# Patient Record
Sex: Female | Born: 2000 | Race: Black or African American | Hispanic: No | Marital: Single | Smoking: Never smoker
Health system: Southern US, Community
[De-identification: ages and names within clinical notes are randomized; demographics above are authoritative.]

## PROBLEM LIST (undated history)

## (undated) ENCOUNTER — Inpatient Hospital Stay (HOSPITAL_COMMUNITY): Payer: Self-pay

## (undated) DIAGNOSIS — Z3490 Encounter for supervision of normal pregnancy, unspecified, unspecified trimester: Secondary | ICD-10-CM

## (undated) DIAGNOSIS — Z789 Other specified health status: Secondary | ICD-10-CM

## (undated) HISTORY — DX: Other specified health status: Z78.9

## (undated) HISTORY — PX: NO PAST SURGERIES: SHX2092

---
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
## 2018-04-09 NOTE — L&D Delivery Note (Signed)
I entered the the room due deep variables lasting 1 minute at most with return to baseline. There was progressively slower return but Armie was pushing well with fetal progress. She was consented for a vacuum if FHR did not improve.  She was able to push for two more contractions after consent and delivered the fetal head easily. Notable shoulder/shawl cord that was delivered through and reduced on the maternal perineum.   Delivery Note At 3:03 PM a viable female was delivered via Vaginal, Spontaneous (Presentation: OA).  APGAR: 8,9; weight-pending  .   Placenta status:Spontaneous ,intact .  Cord: 3VC  with the following complications: shawl/nuchal cord .  Cord pH: not collected  Anesthesia:  Epidural Episiotomy: None Lacerations: Deep peri clitoral and 2nd degree perineal- repaired in typical fashion.  Suture Repair: 3.0 vicryl Est. Blood Loss (mL):    Mom to postpartum.  Baby to Couplet care / Skin to Skin.  Juanita Craver [facility] 12/24/2018, 3:27 PM

## 2018-04-28 ENCOUNTER — Ambulatory Visit (HOSPITAL_COMMUNITY)
Admission: EM | Admit: 2018-04-28 | Discharge: 2018-04-28 | Disposition: A | Discharge status: Home / Self Care | Payer: Medicaid Other | Attending: Family Medicine | Admitting: Family Medicine

## 2018-04-28 ENCOUNTER — Encounter (HOSPITAL_COMMUNITY): Payer: Self-pay | Admitting: Emergency Medicine

## 2018-04-28 DIAGNOSIS — Z3201 Encounter for pregnancy test, result positive: Secondary | ICD-10-CM | POA: Insufficient documentation

## 2018-04-28 LAB — POCT PREGNANCY, URINE: Preg Test, Ur: POSITIVE — AB

## 2018-04-28 MED ORDER — PRENATAL COMPLETE 14-0.4 MG PO TABS: 1.0000 | ORAL_TABLET | Freq: Every day | ORAL | 1 refills | Status: DC

## 2018-04-28 NOTE — Discharge Instructions (Signed)
Your pregnancy test was positive We will have you start a prenatal vitamin You need to follow-up with your OB/GYN for further management of this pregnancy I am giving you a list of medications that are safe during pregnancy

## 2018-04-28 NOTE — ED Triage Notes (Signed)
Pt here requesting pregnancy test; pt sts her period is late
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
## 2018-05-04 ENCOUNTER — Emergency Department (HOSPITAL_COMMUNITY): Payer: Medicaid Other

## 2018-05-04 ENCOUNTER — Other Ambulatory Visit: Payer: Self-pay

## 2018-05-04 ENCOUNTER — Encounter (HOSPITAL_COMMUNITY): Payer: Self-pay | Admitting: Emergency Medicine

## 2018-05-04 ENCOUNTER — Emergency Department (HOSPITAL_COMMUNITY)
Admission: EM | Admit: 2018-05-04 | Discharge: 2018-05-04 | Disposition: A | Discharge status: Home / Self Care | Payer: Medicaid Other | Attending: Emergency Medicine | Admitting: Emergency Medicine

## 2018-05-04 DIAGNOSIS — R102 Pelvic and perineal pain: Secondary | ICD-10-CM | POA: Diagnosis present

## 2018-05-04 DIAGNOSIS — N760 Acute vaginitis: Secondary | ICD-10-CM

## 2018-05-04 DIAGNOSIS — O26891 Other specified pregnancy related conditions, first trimester: Secondary | ICD-10-CM | POA: Insufficient documentation

## 2018-05-04 DIAGNOSIS — O23591 Infection of other part of genital tract in pregnancy, first trimester: Secondary | ICD-10-CM | POA: Diagnosis not present

## 2018-05-04 DIAGNOSIS — B9689 Other specified bacterial agents as the cause of diseases classified elsewhere: Secondary | ICD-10-CM

## 2018-05-04 DIAGNOSIS — Z3A01 Less than 8 weeks gestation of pregnancy: Secondary | ICD-10-CM | POA: Insufficient documentation

## 2018-05-04 LAB — WET PREP, GENITAL
Sperm: NONE SEEN
Trich, Wet Prep: NONE SEEN
WBC, Wet Prep HPF POC: NONE SEEN
YEAST WET PREP: NONE SEEN

## 2018-05-04 LAB — URINALYSIS, ROUTINE W REFLEX MICROSCOPIC
BILIRUBIN URINE: NEGATIVE
Glucose, UA: NEGATIVE mg/dL
HGB URINE DIPSTICK: NEGATIVE
Ketones, ur: NEGATIVE mg/dL
Leukocytes, UA: NEGATIVE
Nitrite: NEGATIVE
Protein, ur: NEGATIVE mg/dL
Specific Gravity, Urine: 1.017 (ref 1.005–1.030)
pH: 8 (ref 5.0–8.0)

## 2018-05-04 LAB — ABO/RH: ABO/RH(D): O POS

## 2018-05-04 LAB — HCG, QUANTITATIVE, PREGNANCY: hCG, Beta Chain, Quant, S: 75988 m[IU]/mL — ABNORMAL HIGH (ref <5)

## 2018-05-04 MED ORDER — METRONIDAZOLE 500 MG PO TABS: 500.0000 mg | ORAL_TABLET | Freq: Two times a day (BID) | ORAL | 0 refills | Status: DC

## 2018-05-04 NOTE — Discharge Instructions (Addendum)
Please take medication as prescribed for bacterial vaginosis Follow-up with OB as scheduled Return if pain is worse or other worsening symptoms such as fever, vomiting

## 2018-05-04 NOTE — ED Triage Notes (Signed)
Pt. Stated, I HAVE LITTLE PAINS OFF AND ON WHICH I dont think I should. No other symptoms just little sharpe pains off and on.
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
## 2018-05-04 NOTE — ED Notes (Signed)
Patient transported to Ultrasound 

## 2018-05-05 LAB — GC/CHLAMYDIA PROBE AMP (~~LOC~~) NOT AT ARMC
Chlamydia: NEGATIVE
Neisseria Gonorrhea: NEGATIVE

## 2018-05-21 ENCOUNTER — Ambulatory Visit (INDEPENDENT_AMBULATORY_CARE_PROVIDER_SITE_OTHER): Payer: Medicaid Other | Admitting: Obstetrics & Gynecology

## 2018-05-21 ENCOUNTER — Encounter: Payer: Self-pay | Admitting: Obstetrics & Gynecology

## 2018-05-21 ENCOUNTER — Other Ambulatory Visit (HOSPITAL_COMMUNITY)
Admission: RE | Admit: 2018-05-21 | Discharge: 2018-05-21 | Disposition: A | Discharge status: Home / Self Care | Payer: Medicaid Other | Source: Ambulatory Visit | Attending: Obstetrics & Gynecology | Admitting: Obstetrics & Gynecology

## 2018-05-21 DIAGNOSIS — Z3401 Encounter for supervision of normal first pregnancy, first trimester: Secondary | ICD-10-CM

## 2018-05-21 DIAGNOSIS — Z3A09 9 weeks gestation of pregnancy: Secondary | ICD-10-CM

## 2018-05-21 DIAGNOSIS — Z3400 Encounter for supervision of normal first pregnancy, unspecified trimester: Secondary | ICD-10-CM | POA: Diagnosis present

## 2018-05-21 LAB — POCT URINALYSIS DIPSTICK OB
Glucose, UA: NEGATIVE
Leukocytes, UA: NEGATIVE
Nitrite, UA: NEGATIVE
POC,PROTEIN,UA: NEGATIVE
RBC UA: NEGATIVE
Spec Grav, UA: 1.015 (ref 1.010–1.025)
pH, UA: 6 (ref 5.0–8.0)

## 2018-05-21 NOTE — Progress Notes (Signed)
DATING AND VIABILITY SONOGRAM   Bianca Webb is a 18 y.o. year old G1P0000 with LMP Patient's last menstrual period was 03/17/2018 (exact date). which would correlate to  [redacted]w[redacted]d weeks gestation.  She has regular menstrual cycles.   She is here today for a confirmatory initial sonogram.    GESTATION: SINGLETON   FETAL ACTIVITY:          Heart rate         175 bpm          The fetus is active.  ADNEXA: The ovaries are normal.   GESTATIONAL AGE AND  BIOMETRICS:  Gestational criteria: Estimated Date of Delivery: 12/22/18 by LMP now at [redacted]w[redacted]d  Previous Scans:0      CROWN RUMP LENGTH          2.46 cm        9-1weeks                                                                               AVERAGE EGA(BY THIS SCAN): 9-1 weeks  WORKING EDD( LMP ):  12-22-2018     TECHNICIAN COMMENTS:     Patient informed that the ultrasound is considered a limited obstetric ultrasound and is not intended to be a complete ultrasound exam. Patient also informed that the ultrasound is not being completed with the intent of assessing for fetal or placental anomalies or any pelvic abnormalities. Explained that the purpose of today's ultrasound is to assess for fetal heart rate. Patient acknowledges the purpose of the exam and the limitations of the study.       Armandina Stammer 05/21/2018 3:40 PM

## 2018-05-21 NOTE — Patient Instructions (Signed)
First Trimester of Pregnancy  The first trimester of pregnancy is from week 1 until the end of week 13 (months 1 through 3). A week after a sperm fertilizes an egg, the egg will implant on the wall of the uterus. This embryo will begin to develop into a baby. Genes from you and your partner will form the baby. The female genes will determine whether the baby will be a boy or a girl. At 6-8 weeks, the eyes and face will be formed, and the heartbeat can be seen on ultrasound. At the end of 12 weeks, all the baby's organs will be formed.  Now that you are pregnant, you will want to do everything you can to have a healthy baby. Two of the most important things are to get good prenatal care and to follow your health care provider's instructions. Prenatal care is all the medical care you receive before the baby's birth. This care will help prevent, find, and treat any problems during the pregnancy and childbirth.  Body changes during your first trimester  Your body goes through many changes during pregnancy. The changes vary from woman to woman.   You may gain or lose a couple of pounds at first.   You may feel sick to your stomach (nauseous) and you may throw up (vomit). If the vomiting is uncontrollable, call your health care provider.   You may tire easily.   You may develop headaches that can be relieved by medicines. All medicines should be approved by your health care provider.   You may urinate more often. Painful urination may mean you have a bladder infection.   You may develop heartburn as a result of your pregnancy.   You may develop constipation because certain hormones are causing the muscles that push stool through your intestines to slow down.   You may develop hemorrhoids or swollen veins (varicose veins).   Your breasts may begin to grow larger and become tender. Your nipples may stick out more, and the tissue that surrounds them (areola) may become darker.   Your gums may bleed and may be  sensitive to brushing and flossing.   Dark spots or blotches (chloasma, mask of pregnancy) may develop on your face. This will likely fade after the baby is born.   Your menstrual periods will stop.   You may have a loss of appetite.   You may develop cravings for certain kinds of food.   You may have changes in your emotions from day to day, such as being excited to be pregnant or being concerned that something may go wrong with the pregnancy and baby.   You may have more vivid and strange dreams.   You may have changes in your hair. These can include thickening of your hair, rapid growth, and changes in texture. Some women also have hair loss during or after pregnancy, or hair that feels dry or thin. Your hair will most likely return to normal after your baby is born.  What to expect at prenatal visits  During a routine prenatal visit:   You will be weighed to make sure you and the baby are growing normally.   Your blood pressure will be taken.   Your abdomen will be measured to track your baby's growth.   The fetal heartbeat will be listened to between weeks 10 and 14 of your pregnancy.   Test results from any previous visits will be discussed.  Your health care provider may ask you:     How you are feeling.   If you are feeling the baby move.   If you have had any abnormal symptoms, such as leaking fluid, bleeding, severe headaches, or abdominal cramping.   If you are using any tobacco products, including cigarettes, chewing tobacco, and electronic cigarettes.   If you have any questions.  Other tests that may be performed during your first trimester include:   Blood tests to find your blood type and to check for the presence of any previous infections. The tests will also be used to check for low iron levels (anemia) and protein on red blood cells (Rh antibodies). Depending on your risk factors, or if you previously had diabetes during pregnancy, you may have tests to check for high blood sugar  that affects pregnant women (gestational diabetes).   Urine tests to check for infections, diabetes, or protein in the urine.   An ultrasound to confirm the proper growth and development of the baby.   Fetal screens for spinal cord problems (spina bifida) and Down syndrome.   HIV (human immunodeficiency virus) testing. Routine prenatal testing includes screening for HIV, unless you choose not to have this test.   You may need other tests to make sure you and the baby are doing well.  Follow these instructions at home:  Medicines   Follow your health care provider's instructions regarding medicine use. Specific medicines may be either safe or unsafe to take during pregnancy.   Take a prenatal vitamin that contains at least 600 micrograms (mcg) of folic acid.   If you develop constipation, try taking a stool softener if your health care provider approves.  Eating and drinking     Eat a balanced diet that includes fresh fruits and vegetables, whole grains, good sources of protein such as meat, eggs, or tofu, and low-fat dairy. Your health care provider will help you determine the amount of weight gain that is right for you.   Avoid raw meat and uncooked cheese. These carry germs that can cause birth defects in the baby.   Eating four or five small meals rather than three large meals a day may help relieve nausea and vomiting. If you start to feel nauseous, eating a few soda crackers can be helpful. Drinking liquids between meals, instead of during meals, also seems to help ease nausea and vomiting.   Limit foods that are high in fat and processed sugars, such as fried and sweet foods.   To prevent constipation:  ? Eat foods that are high in fiber, such as fresh fruits and vegetables, whole grains, and beans.  ? Drink enough fluid to keep your urine clear or pale yellow.  Activity   Exercise only as directed by your health care provider. Most women can continue their usual exercise routine during  pregnancy. Try to exercise for 30 minutes at least 5 days a week. Exercising will help you:  ? Control your weight.  ? Stay in shape.  ? Be prepared for labor and delivery.   Experiencing pain or cramping in the lower abdomen or lower back is a good sign that you should stop exercising. Check with your health care provider before continuing with normal exercises.   Try to avoid standing for long periods of time. Move your legs often if you must stand in one place for a long time.   Avoid heavy lifting.   Wear low-heeled shoes and practice good posture.   You may continue to have sex unless your health care   provider tells you not to.  Relieving pain and discomfort   Wear a good support bra to relieve breast tenderness.   Take warm sitz baths to soothe any pain or discomfort caused by hemorrhoids. Use hemorrhoid cream if your health care provider approves.   Rest with your legs elevated if you have leg cramps or low back pain.   If you develop varicose veins in your legs, wear support hose. Elevate your feet for 15 minutes, 3-4 times a day. Limit salt in your diet.  Prenatal care   Schedule your prenatal visits by the twelfth week of pregnancy. They are usually scheduled monthly at first, then more often in the last 2 months before delivery.   Write down your questions. Take them to your prenatal visits.   Keep all your prenatal visits as told by your health care provider. This is important.  Safety   Wear your seat belt at all times when driving.   Make a list of emergency phone numbers, including numbers for family, friends, the hospital, and police and fire departments.  General instructions   Ask your health care provider for a referral to a local prenatal education class. Begin classes no later than the beginning of month 6 of your pregnancy.   Ask for help if you have counseling or nutritional needs during pregnancy. Your health care provider can offer advice or refer you to specialists for help  with various needs.   Do not use hot tubs, steam rooms, or saunas.   Do not douche or use tampons or scented sanitary pads.   Do not cross your legs for long periods of time.   Avoid cat litter boxes and soil used by cats. These carry germs that can cause birth defects in the baby and possibly loss of the fetus by miscarriage or stillbirth.   Avoid all smoking, herbs, alcohol, and medicines not prescribed by your health care provider. Chemicals in these products affect the formation and growth of the baby.   Do not use any products that contain nicotine or tobacco, such as cigarettes and e-cigarettes. If you need help quitting, ask your health care provider. You may receive counseling support and other resources to help you quit.   Schedule a dentist appointment. At home, brush your teeth with a soft toothbrush and be gentle when you floss.  Contact a health care provider if:   You have dizziness.   You have mild pelvic cramps, pelvic pressure, or nagging pain in the abdominal area.   You have persistent nausea, vomiting, or diarrhea.   You have a bad smelling vaginal discharge.   You have pain when you urinate.   You notice increased swelling in your face, hands, legs, or ankles.   You are exposed to fifth disease or chickenpox.   You are exposed to German measles (rubella) and have never had it.  Get help right away if:   You have a fever.   You are leaking fluid from your vagina.   You have spotting or bleeding from your vagina.   You have severe abdominal cramping or pain.   You have rapid weight gain or loss.   You vomit blood or material that looks like coffee grounds.   You develop a severe headache.   You have shortness of breath.   You have any kind of trauma, such as from a fall or a car accident.  Summary   The first trimester of pregnancy is from week 1 until   the end of week 13 (months 1 through 3).   Your body goes through many changes during pregnancy. The changes vary from  woman to woman.   You will have routine prenatal visits. During those visits, your health care provider will examine you, discuss any test results you may have, and talk with you about how you are feeling.  This information is not intended to replace advice given to you by your health care provider. Make sure you discuss any questions you have with your health care provider.  Document Released: 03/20/2001 Document Revised: 03/07/2016 Document Reviewed: 03/07/2016  [employer]vier Interactive Patient Education  2019 [employer]
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
## 2018-05-23 LAB — GC/CHLAMYDIA PROBE AMP (~~LOC~~) NOT AT ARMC
CHLAMYDIA, DNA PROBE: NEGATIVE
Neisseria Gonorrhea: NEGATIVE

## 2018-05-23 LAB — CULTURE, OB URINE

## 2018-05-23 LAB — URINE CULTURE, OB REFLEX

## 2018-05-30 LAB — OBSTETRIC PANEL, INCLUDING HIV
ANTIBODY SCREEN: NEGATIVE
Basophils Absolute: 0 10*3/uL (ref 0.0–0.2)
Basos: 0 %
EOS (ABSOLUTE): 0.1 10*3/uL (ref 0.0–0.4)
Eos: 1 %
HIV Screen 4th Generation wRfx: NONREACTIVE
Hematocrit: 39.4 % (ref 34.0–46.6)
Hemoglobin: 13.2 g/dL (ref 11.1–15.9)
Hepatitis B Surface Ag: NEGATIVE
Immature Grans (Abs): 0 10*3/uL (ref 0.0–0.1)
Immature Granulocytes: 0 %
Lymphocytes Absolute: 2.9 10*3/uL (ref 0.7–3.1)
Lymphs: 29 %
MCH: 30.9 pg (ref 26.6–33.0)
MCHC: 33.5 g/dL (ref 31.5–35.7)
MCV: 92 fL (ref 79–97)
Monocytes Absolute: 0.6 10*3/uL (ref 0.1–0.9)
Monocytes: 6 %
NEUTROS PCT: 64 %
Neutrophils Absolute: 6.4 10*3/uL (ref 1.4–7.0)
Platelets: 226 10*3/uL (ref 150–450)
RBC: 4.27 x10E6/uL (ref 3.77–5.28)
RDW: 13.3 % (ref 11.7–15.4)
RPR Ser Ql: NONREACTIVE
Rh Factor: POSITIVE
Rubella Antibodies, IGG: 8.4 index (ref >0.99)
WBC: 10 10*3/uL (ref 3.4–10.8)

## 2018-05-30 LAB — SMN1 COPY NUMBER ANALYSIS (SMA CARRIER SCREENING)

## 2018-05-30 LAB — HEMOGLOBINOPATHY EVALUATION
Ferritin: 59 ng/mL (ref 15–77)
Hgb A2 Quant: 2.8 % (ref 1.8–3.2)
Hgb A: 97.2 % (ref 96.4–98.8)
Hgb C: 0 %
Hgb F Quant: 0 % (ref 0.0–2.0)
Hgb S: 0 %
Hgb Solubility: NEGATIVE
Hgb Variant: 0 %

## 2018-06-18 ENCOUNTER — Other Ambulatory Visit: Payer: Self-pay

## 2018-06-18 ENCOUNTER — Ambulatory Visit (INDEPENDENT_AMBULATORY_CARE_PROVIDER_SITE_OTHER): Payer: Medicaid Other | Admitting: Certified Nurse Midwife

## 2018-06-18 DIAGNOSIS — Z3400 Encounter for supervision of normal first pregnancy, unspecified trimester: Secondary | ICD-10-CM

## 2018-06-18 DIAGNOSIS — Z3401 Encounter for supervision of normal first pregnancy, first trimester: Secondary | ICD-10-CM

## 2018-06-18 DIAGNOSIS — Z3A13 13 weeks gestation of pregnancy: Secondary | ICD-10-CM
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
## 2018-06-19 ENCOUNTER — Encounter: Payer: Medicaid Other | Admitting: Family Medicine

## 2018-06-19 NOTE — Progress Notes (Signed)
Subjective: Bianca Webb is a G1P0000 at [redacted]w[redacted]d who presents to the [facility] today for ob visit.  She does not have a history of any mental health concerns. She is not currently sexually active. She is previously used pills for birth control.  Patient states family  as her support system.   BP 100/67   Pulse 88   Wt 132 lb (59.9 kg)   LMP 03/17/2018 (Exact Date)   BMI 25.78 kg/m   Birth Control History:  pills  MDM Patient counseled on all options for birth control today including LARC. Patient desires nexplanon or depo  initiated for birth control.   Assessment:  18 y.o. female considering additional family planning  for birth control  Plan: additional family planning counseling   Bianca Webb 06/19/2018 4:16 PM

## 2018-06-24 ENCOUNTER — Encounter: Payer: Self-pay | Admitting: Certified Nurse Midwife

## 2018-07-01 ENCOUNTER — Telehealth: Payer: Self-pay | Admitting: Obstetrics

## 2018-07-01 ENCOUNTER — Encounter: Payer: Self-pay | Admitting: Certified Nurse Midwife

## 2018-07-01 NOTE — Telephone Encounter (Signed)
Called patient and discussed abnormal test for SMA.  All questions answered to her satisfaction.  MFM referral to be made.

## 2018-07-01 NOTE — Telephone Encounter (Signed)
Called patient to discuss abnormal SMA test.  No answer.  Left message to call office. 

## 2018-07-14 ENCOUNTER — Ambulatory Visit (HOSPITAL_COMMUNITY): Payer: Medicaid Other

## 2018-07-17 ENCOUNTER — Ambulatory Visit (INDEPENDENT_AMBULATORY_CARE_PROVIDER_SITE_OTHER): Payer: Medicaid Other | Admitting: Certified Nurse Midwife

## 2018-07-17 ENCOUNTER — Other Ambulatory Visit: Payer: Self-pay

## 2018-07-17 VITALS — BP 103/71 | HR 97 | Wt 143.0 lb

## 2018-07-17 DIAGNOSIS — Z3400 Encounter for supervision of normal first pregnancy, unspecified trimester: Secondary | ICD-10-CM

## 2018-07-17 DIAGNOSIS — Z3A17 17 weeks gestation of pregnancy: Secondary | ICD-10-CM

## 2018-07-17 DIAGNOSIS — O26899 Other specified pregnancy related conditions, unspecified trimester: Secondary | ICD-10-CM

## 2018-07-17 DIAGNOSIS — R102 Pelvic and perineal pain: Secondary | ICD-10-CM

## 2018-07-17 DIAGNOSIS — O26892 Other specified pregnancy related conditions, second trimester: Secondary | ICD-10-CM

## 2018-07-17 MED ORDER — COMFORT FIT MATERNITY SUPP MED MISC: 1.0000 | Freq: Every day | 0 refills | Status: DC
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
## 2018-07-17 NOTE — Patient Instructions (Signed)
° ° °Coronavirus (COVID-19) and Pregnancy:  Frequently Asked Questions ° ° °How might coronavirus affect my pregnancy? °The data for COVID-19 is limited, but we know that women with other coronavirus infections (such as SARS-CoV) did NOT have miscarriage or stillbirth at higher rates than the general population.  On the other hand, we know that having other respiratory viral infections during pregnancy, such as flu, has been associated with problems like low birth weight and preterm birth. Also, having a high fever early in pregnancy may increase the risk of certain birth defects. ° °Could I transmit coronavirus to my baby during pregnancy or delivery? °Among the few case studies of infants born to mothers with COVID-19 published in peer-reviewed literature, none of the infants tested positive for the virus. And there have been no reports of mother-to-baby transmission for other coronaviruses (MERS-CoV and SARS-CoV). Also, there was no virus detected in samples of amniotic fluid or breast milk. °But there have been a few reports of newborns as young as a few days old with infection, suggesting that a mother can transmit the infection to her infant through close contact after delivery. ° °Is it safe for me to deliver at a hospital where there have been COVID-19 cases? °It should be. We know that COVID-19 is a very scary virus. The good news is that hospitals are taking great precautions to keep patients and healthcare providers safe.  According to the CDC guidelines, when a patient is even suspected to have COVID-19, they should be placed in a negative pressure room. (Think of these rooms as vacuums that suck and filter the air so it's safe for the other people in the hospital.) If there are no rooms available, these patients should be asked to wait at home until they can be accomodated safely. This should make it possible for you to deliver at the hospital without putting you or your baby at risk.  °Hospitals are  also implementing stricter visiting policies to keep patients safe. It's worth calling your hospital to check if there are any new regulations to be aware of. ° °What plans should I make now in case the hospital system is overwhelmed when it's time for me to deliver? °Every hospital is making different plans for dealing with this scenario. Talk with your doctor or midwife once you're at least [redacted] weeks pregnant. °I work in healthcare.  ° °I work in healthcare. Should I ask my doctor to excuse me from work until the baby is born? Should I ask my doctor to excuse me from work until the baby is born? What if I work in a school, the travel industry, or some other high-risk setting? °Healthcare facilities should take care to limit the exposure of pregnant employees to patients with confirmed or suspected COVID-19, just as they would with other infectious cases. If you continue working, be sure to follow the CDC's risk assessment and infection control guidelines. ° °If you work in a school, travel industry, or other high-risk setting, talk with your employer about what it's doing to protect employees and minimize infection risks. Wash your hands often. ° ° °What if my OB gets COVID-19? °If your doctor or midwife tests positive for COVID-19, they will need to be quarantined until they recover and are no longer at risk of transmitting the virus. In this case, you'll be assigned to another OB in your doctor's practice (or you may choose another practitioner yourself). °Ask your new OB or your doctor's office if you should   self-quarantine or be tested for the virus. (It will depend on when you last saw your provider and when that person tested positive.) ° °Should we hold off on trying to conceive because of COVID-19? °At this time, there's no reason to hold off on trying to get pregnant, but the data we have is really limited. For example, we don't think the virus causes birth defects or increases your risk of miscarriage.  But we don't know for sure whether you could transmit COVID-19 to your baby before or during delivery. °We also don't know if the virus lives in semen or can be sexually transmitted. ° °We have a babymoon scheduled in the next few months - should we cancel? °Yes. At this time, the virus has reached more than 140 countries, and there are travel bans to [place], most of [place], and [place]. Places where large numbers of people gather are at highest risk, especially airports and cruise ships.  If you were planning travel in the [place] note that any travel setting increases your risk of exposure, and there are already many places where everyone is being asked to stay home. To see how the virus is spreading, check The [place] Times map based on CDC data.  °For the most current advice to help you avoid exposure, check the CDC's COVID-19 travel page. ° °Will the hospital separate me from my newborn and keep the baby in quarantine? °If you don't have COVID-19 and have not been exposed to the virus, the hospital will not separate you from your newborn. °If you do test positive for COVID-19 or have been exposed but have no symptoms, the CDC, American College of Obstetricians and Gynecologists, and the Society for Maternal-Fetal Medicine all recommend that you be separated from your baby to decrease the risk of transmission to the baby. This would last until you are no longer at risk of transmitting the virus.  °This scenario would, of course, be beyond heartbreaking. Talk to the hospital, your baby's pediatrician, and your family about how to plan for care of your baby in the event that you have to be separated after delivery. And try to make sure you have the emotional support you would need to endure the sadness and stress of having to potentially wait weeks to meet your newborn. ° ° °My hospital is restricting visitors and only allowing one support person. If my support person leaves after the delivery, will they be allowed to  come back? °Every hospital has different policies. Contact your hospital or labor and delivery unit a week or so before delivery to get the most up-to-date restrictions. °In general, if your support person needs to leave, they would be allowed back unless they knew they were exposed to COVID-19 after leaving your company. ° °My mom was planning to fly here to help me care for my new baby after delivery. Should I tell her not to come? °Yes. If your mom is over 60 or has any serious chronic medical conditions (such as heart disease, lung disease, or diabetes), she is at higher risk of serious illness from COVID-19 and should avoid air travel. °And remember that any travel setting increases a person's risk of exposure. So, it may be risky to have her around the baby after she has been traveling. °For the most current advice on traveling, check the CDC's COVID-19 travel page. °

## 2018-07-19 LAB — AFP, SERUM, OPEN SPINA BIFIDA
AFP MoM: 1.3
AFP Value: 57.9 ng/mL
Gest. Age on Collection Date: 17.3 weeks
Maternal Age At EDD: 20 yr
OSBR Risk 1 IN: 9606
Test Results:: NEGATIVE
Weight: 143 [lb_av]

## 2018-07-28 ENCOUNTER — Ambulatory Visit (HOSPITAL_COMMUNITY): Payer: Medicaid Other

## 2018-07-28 ENCOUNTER — Other Ambulatory Visit (HOSPITAL_COMMUNITY): Payer: Medicaid Other

## 2018-08-01 ENCOUNTER — Ambulatory Visit (HOSPITAL_COMMUNITY)
Admission: RE | Admit: 2018-08-01 | Discharge: 2018-08-01 | Disposition: A | Discharge status: Home / Self Care | Payer: Medicaid Other | Source: Ambulatory Visit | Attending: Obstetrics and Gynecology | Admitting: Obstetrics and Gynecology

## 2018-08-01 ENCOUNTER — Other Ambulatory Visit: Payer: Self-pay

## 2018-08-01 DIAGNOSIS — Z3A19 19 weeks gestation of pregnancy: Secondary | ICD-10-CM | POA: Diagnosis not present

## 2018-08-01 DIAGNOSIS — Z363 Encounter for antenatal screening for malformations: Secondary | ICD-10-CM

## 2018-08-01 DIAGNOSIS — Z3400 Encounter for supervision of normal first pregnancy, unspecified trimester: Secondary | ICD-10-CM | POA: Insufficient documentation

## 2018-08-06 ENCOUNTER — Inpatient Hospital Stay (HOSPITAL_COMMUNITY)
Admission: AD | Admit: 2018-08-06 | Discharge: 2018-08-06 | Disposition: A | Discharge status: Home / Self Care | Payer: Medicaid Other | Attending: Family Medicine | Admitting: Family Medicine

## 2018-08-06 ENCOUNTER — Other Ambulatory Visit: Payer: Self-pay

## 2018-08-06 ENCOUNTER — Encounter (HOSPITAL_COMMUNITY): Payer: Self-pay

## 2018-08-06 DIAGNOSIS — O9989 Other specified diseases and conditions complicating pregnancy, childbirth and the puerperium: Secondary | ICD-10-CM | POA: Diagnosis present

## 2018-08-06 DIAGNOSIS — K5901 Slow transit constipation: Secondary | ICD-10-CM

## 2018-08-06 DIAGNOSIS — Z79899 Other long term (current) drug therapy: Secondary | ICD-10-CM | POA: Diagnosis not present

## 2018-08-06 DIAGNOSIS — O26892 Other specified pregnancy related conditions, second trimester: Secondary | ICD-10-CM | POA: Diagnosis not present

## 2018-08-06 DIAGNOSIS — O26899 Other specified pregnancy related conditions, unspecified trimester: Secondary | ICD-10-CM

## 2018-08-06 DIAGNOSIS — Z3A20 20 weeks gestation of pregnancy: Secondary | ICD-10-CM | POA: Diagnosis not present

## 2018-08-06 DIAGNOSIS — O99612 Diseases of the digestive system complicating pregnancy, second trimester: Secondary | ICD-10-CM | POA: Insufficient documentation

## 2018-08-06 DIAGNOSIS — R102 Pelvic and perineal pain unspecified side: Secondary | ICD-10-CM

## 2018-08-06 LAB — URINALYSIS, ROUTINE W REFLEX MICROSCOPIC
Bilirubin Urine: NEGATIVE
Glucose, UA: NEGATIVE mg/dL
Hgb urine dipstick: NEGATIVE
Ketones, ur: NEGATIVE mg/dL
Leukocytes,Ua: NEGATIVE
Nitrite: NEGATIVE
Protein, ur: NEGATIVE mg/dL
Specific Gravity, Urine: 1.018 (ref 1.005–1.030)
pH: 7 (ref 5.0–8.0)

## 2018-08-06 LAB — WET PREP, GENITAL
Sperm: NONE SEEN
Trich, Wet Prep: NONE SEEN
Yeast Wet Prep HPF POC: NONE SEEN

## 2018-08-06 MED ORDER — ACETAMINOPHEN 500 MG PO TABS
1000.0000 mg | ORAL_TABLET | Freq: Four times a day (QID) | ORAL | Status: DC | PRN
  Administered 2018-08-06: 09:00:00 1000 mg via ORAL
  Filled 2018-08-06: qty 2

## 2018-08-06 MED ORDER — CYCLOBENZAPRINE HCL 10 MG PO TABS
10.0000 mg | ORAL_TABLET | Freq: Three times a day (TID) | ORAL | Status: DC | PRN
  Administered 2018-08-06: 09:00:00 10 mg via ORAL
  Filled 2018-08-06: qty 1

## 2018-08-06 NOTE — MAU Note (Addendum)
Pt reports sharp LLQ pain since waking up this morning around 0630 that worsens when she moves.  Pt denies vag bleeding or dc.  Pt reports 1 episode of vomiting.

## 2018-08-06 NOTE — MAU Provider Note (Signed)
History     CSN: [id number]  Arrival date and time: 08/06/18 8159   First Provider Initiated Contact with Patient 08/06/18 0805      Chief Complaint  Patient presents with  . Abdominal Pain   G1 @20 .2 wks presenting via EMS for LLQ pain. Pain started around 6am. Describes as sharp and intermittent, worse with movement and standing. No VB or discharge. No urinary sx. Last BM 2 days ago. Her pregnancy has been uncomplicated.   OB History    Gravida  1   Para  0   Term  0   Preterm  0   AB  0   Living  0     SAB  0   TAB  0   Ectopic  0   Multiple  0   Live Births  0           History reviewed. No pertinent past medical history.  Past Surgical History:  Procedure Laterality Date  . NO PAST SURGERIES      Family History  Problem Relation Age of Onset  . Cancer Neg Hx   . Hypertension Neg Hx   . Eclampsia Neg Hx   . Diabetes Neg Hx     Social History   Tobacco Use  . Smoking status: Never Smoker  . Smokeless tobacco: Never Used  Substance Use Topics  . Alcohol use: No  . Drug use: Not Currently    Allergies: No Known Allergies  Medications Prior to Admission  Medication Sig Dispense Refill Last Dose  . Benzoyl Peroxide 2.5 % CREA Apply 1 application topically daily. 21 g 0 Taking  . cetirizine (ZYRTEC) 10 MG tablet Take 1 tablet (10 mg total) by mouth daily. (Patient not taking: Reported on 05/21/2018) 15 tablet 0 Not Taking  . doxycycline (VIBRAMYCIN) 100 MG capsule Take 1 capsule (100 mg total) by mouth 2 (two) times daily. (Patient not taking: Reported on 04/28/2018) 20 capsule 0 Not Taking  . Elastic Bandages & Supports (COMFORT FIT MATERNITY SUPP MED) MISC 1 Device by Does not apply route daily. 1 each 0   . Prenatal MV-Min-FA-Omega-3 (PRENATAL GUMMIES/DHA & FA) 0.4-32.5 MG CHEW Chew by mouth.   Taking  . Prenatal Vit-Fe Fumarate-FA (PRENATAL COMPLETE) 14-0.4 MG TABS Take 1 tablet by mouth daily. (Patient not taking: Reported on 05/21/2018)  60 each 1 Not Taking    Review of Systems  Constitutional: Negative for fever.  Gastrointestinal: Positive for abdominal pain. Negative for constipation, diarrhea, nausea and vomiting.  Genitourinary: Negative for dysuria, vaginal bleeding and vaginal discharge.   Physical Exam   Blood pressure 107/72, pulse 86, temperature 98.1 F (36.7 C), temperature source Oral, resp. rate 19, last menstrual period 03/17/2018, SpO2 100 %.  Physical Exam  Constitutional: She is oriented to person, place, and time. She appears well-developed and well-nourished. No distress.  HENT:  Head: Normocephalic and atraumatic.  Neck: Normal range of motion.  Cardiovascular: Normal rate and regular rhythm.  Respiratory: Effort normal. No respiratory distress.  GI: Soft. She exhibits no distension and no mass. There is no abdominal tenderness. There is no rebound and no guarding.  Genitourinary:    Genitourinary Comments: SVE closed/long   Musculoskeletal: Normal range of motion.  Neurological: She is alert and oriented to person, place, and time.  Skin: Skin is warm and dry.  Psychiatric: She has a normal mood and affect.  FHT 157  Results for orders placed or performed during the hospital encounter of 08/06/18 (  from the past 24 hour(s))  Urinalysis, Routine w reflex microscopic     Status: None   Collection Time: 08/06/18  8:14 AM  Result Value Ref Range   Color, Urine YELLOW YELLOW   APPearance CLEAR CLEAR   Specific Gravity, Urine 1.018 1.005 - 1.030   pH 7.0 5.0 - 8.0   Glucose, UA NEGATIVE NEGATIVE mg/dL   Hgb urine dipstick NEGATIVE NEGATIVE   Bilirubin Urine NEGATIVE NEGATIVE   Ketones, ur NEGATIVE NEGATIVE mg/dL   Protein, ur NEGATIVE NEGATIVE mg/dL   Nitrite NEGATIVE NEGATIVE   Leukocytes,Ua NEGATIVE NEGATIVE  Wet prep, genital     Status: Abnormal   Collection Time: 08/06/18  8:14 AM  Result Value Ref Range   Yeast Wet Prep HPF POC NONE SEEN NONE SEEN   Trich, Wet Prep NONE SEEN NONE  SEEN   Clue Cells Wet Prep HPF POC PRESENT (A) NONE SEEN   WBC, Wet Prep HPF POC FEW (A) NONE SEEN   Sperm NONE SEEN     MAU Course  Procedures Orders Placed This Encounter  Procedures  . Wet prep, genital    Standing Status:   Standing    Number of Occurrences:   1    Order Specific Question:   Patient immune status    Answer:   Normal  . Urinalysis, Routine w reflex microscopic    Standing Status:   Standing    Number of Occurrences:   1  . Discharge patient    Order Specific Question:   Discharge disposition    Answer:   01-Home or Self Care [1]    Order Specific Question:   Discharge patient date    Answer:   08/06/2018   Meds ordered this encounter  Medications  . cyclobenzaprine (FLEXERIL) tablet 10 mg  . acetaminophen (TYLENOL) tablet 1,000 mg   MDM Labs ordered and reviewed. Feeling better after meds. Pain likely RL or constipation. Discussed comfort measures. Stable for discharge home.   Assessment and Plan   1. [redacted] weeks gestation of pregnancy   2. Pain of round ligament during pregnancy   3. Slow transit constipation    [facility] SAB precautions Increase water and dietary fiber  Allergies as of 08/06/2018   No Known Allergies     Medication List    STOP taking these medications   Benzoyl Peroxide 2.5 % Crea   cetirizine 10 MG tablet Commonly known as:  ZYRTEC   doxycycline 100 MG capsule Commonly known as:  VIBRAMYCIN   Prenatal Complete 14-0.4 MG Tabs     TAKE these medications   Comfort Fit Maternity Supp Med Misc 1 Device by Does not apply route daily.   Prenatal Gummies/DHA & FA 0.4-32.5 MG Chew Chew by mouth.       Donette LarryMelanie Rayann Jolley, CNM 08/06/2018, 9:37 AM

## 2018-08-06 NOTE — Discharge Instructions (Signed)
Round Ligament Pain  The round ligament is a cord of muscle and tissue that helps support the uterus. It can become a source of pain during pregnancy if it becomes stretched or twisted as the baby grows. The pain usually begins in the second trimester (13-28 weeks) of pregnancy, and it can come and go until the baby is delivered. It is not a serious problem, and it does not cause harm to the baby. Round ligament pain is usually a short, sharp, and pinching pain, but it can also be a dull, lingering, and aching pain. The pain is felt in the lower side of the abdomen or in the groin. It usually starts deep in the groin and moves up to the outside of the hip area. The pain may occur when you:  Suddenly change position, such as quickly going from a sitting to standing position.  Roll over in bed.  Cough or sneeze.  Do physical activity. Follow these instructions at home:   Watch your condition for any changes.  When the pain starts, relax. Then try any of these methods to help with the pain: ? Sitting down. ? Flexing your knees up to your abdomen. ? Lying on your side with one pillow under your abdomen and another pillow between your legs. ? Sitting in a warm bath for 15-20 minutes or until the pain goes away.  Take over-the-counter and prescription medicines only as told by your health care provider.  Move slowly when you sit down or stand up.  Avoid long walks if they cause pain.  Stop or reduce your physical activities if they cause pain.  Keep all follow-up visits as told by your health care provider. This is important. Contact a health care provider if:  Your pain does not go away with treatment.  You feel pain in your back that you did not have before.  Your medicine is not helping. Get help right away if:  You have a fever or chills.  You develop uterine contractions.  You have vaginal bleeding.  You have nausea or vomiting.  You have diarrhea.  You have pain  when you urinate. Summary  Round ligament pain is felt in the lower abdomen or groin. It is usually a short, sharp, and pinching pain. It can also be a dull, lingering, and aching pain.  This pain usually begins in the second trimester (13-28 weeks). It occurs because the uterus is stretching with the growing baby, and it is not harmful to the baby.  You may notice the pain when you suddenly change position, when you cough or sneeze, or during physical activity.  Relaxing, flexing your knees to your abdomen, lying on one side, or taking a warm bath may help to get rid of the pain.  Get help from your health care provider if the pain does not go away or if you have vaginal bleeding, nausea, vomiting, diarrhea, or painful urination. This information is not intended to replace advice given to you by your health care provider. Make sure you discuss any questions you have with your health care provider. Document Released: 01/03/2008 Document Revised: 09/11/2017 Document Reviewed: 09/11/2017 Elsevier Interactive Patient Education  2019 [employer].   Constipation, Adult Constipation is when a person:  Poops (has a bowel movement) fewer times in a week than normal.  Has a hard time pooping.  Has poop that is dry, hard, or bigger than normal. Follow these instructions at home: Eating and drinking   Eat foods that  have a lot of fiber, such as: ? Fresh fruits and vegetables. ? Whole grains. ? Beans.  Eat less of foods that are high in fat, low in fiber, or overly processed, such as: ? [place] fries. ? Hamburgers. ? Cookies. ? Candy. ? Soda.  Drink enough fluid to keep your pee (urine) clear or pale yellow. General instructions  Exercise regularly or as told by your doctor.  Go to the restroom when you feel like you need to poop. Do not hold it in.  Take over-the-counter and prescription medicines only as told by your doctor. These include any fiber supplements.  Do pelvic  floor retraining exercises, such as: ? Doing deep breathing while relaxing your lower belly (abdomen). ? Relaxing your pelvic floor while pooping.  Watch your condition for any changes.  Keep all follow-up visits as told by your doctor. This is important. Contact a doctor if:  You have pain that gets worse.  You have a fever.  You have not pooped for 4 days.  You throw up (vomit).  You are not hungry.  You lose weight.  You are bleeding from the anus.  You have thin, pencil-like poop (stool). Get help right away if:  You have a fever, and your symptoms suddenly get worse.  You leak poop or have blood in your poop.  Your belly feels hard or bigger than normal (is bloated).  You have very bad belly pain.  You feel dizzy or you faint. This information is not intended to replace advice given to you by your health care provider. Make sure you discuss any questions you have with your health care provider. Document Released: 09/12/2007 Document Revised: 10/14/2015 Document Reviewed: 09/14/2015 Elsevier Interactive Patient Education  2019 [employer]

## 2018-08-07 LAB — GC/CHLAMYDIA PROBE AMP (~~LOC~~) NOT AT ARMC
Chlamydia: NEGATIVE
Neisseria Gonorrhea: NEGATIVE

## 2018-08-14 ENCOUNTER — Encounter: Payer: Self-pay | Admitting: Certified Nurse Midwife

## 2018-08-14 ENCOUNTER — Ambulatory Visit (INDEPENDENT_AMBULATORY_CARE_PROVIDER_SITE_OTHER): Payer: Medicaid Other | Admitting: Certified Nurse Midwife

## 2018-08-14 ENCOUNTER — Other Ambulatory Visit: Payer: Self-pay

## 2018-08-14 VITALS — Wt 154.3 lb

## 2018-08-14 DIAGNOSIS — R102 Pelvic and perineal pain: Secondary | ICD-10-CM

## 2018-08-14 DIAGNOSIS — O26899 Other specified pregnancy related conditions, unspecified trimester: Secondary | ICD-10-CM

## 2018-08-14 DIAGNOSIS — Z3400 Encounter for supervision of normal first pregnancy, unspecified trimester: Secondary | ICD-10-CM

## 2018-08-14 DIAGNOSIS — Z3A21 21 weeks gestation of pregnancy: Secondary | ICD-10-CM

## 2018-08-14 DIAGNOSIS — O26892 Other specified pregnancy related conditions, second trimester: Secondary | ICD-10-CM

## 2018-08-14 NOTE — Progress Notes (Signed)
Pt presents for webex visit. Pt identified with two pt identifiers. Last bp check was 08/12/18. This is in the babyrx data. Pt has no concerns today.
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
## 2018-09-22 ENCOUNTER — Ambulatory Visit (INDEPENDENT_AMBULATORY_CARE_PROVIDER_SITE_OTHER): Payer: Medicaid Other | Admitting: Obstetrics and Gynecology

## 2018-09-22 ENCOUNTER — Encounter: Payer: Self-pay | Admitting: Obstetrics and Gynecology

## 2018-09-22 ENCOUNTER — Other Ambulatory Visit: Payer: Self-pay

## 2018-09-22 ENCOUNTER — Other Ambulatory Visit: Payer: Medicaid Other

## 2018-09-22 ENCOUNTER — Other Ambulatory Visit (HOSPITAL_COMMUNITY)
Admission: RE | Admit: 2018-09-22 | Discharge: 2018-09-22 | Disposition: A | Discharge status: Home / Self Care | Payer: Medicaid Other | Source: Ambulatory Visit | Attending: Advanced Practice Midwife | Admitting: Advanced Practice Midwife

## 2018-09-22 VITALS — BP 107/68 | HR 100 | Wt 166.8 lb

## 2018-09-22 DIAGNOSIS — Z3A27 27 weeks gestation of pregnancy: Secondary | ICD-10-CM

## 2018-09-22 DIAGNOSIS — N898 Other specified noninflammatory disorders of vagina: Secondary | ICD-10-CM | POA: Insufficient documentation

## 2018-09-22 DIAGNOSIS — Z3402 Encounter for supervision of normal first pregnancy, second trimester: Secondary | ICD-10-CM

## 2018-09-22 DIAGNOSIS — Z23 Encounter for immunization: Secondary | ICD-10-CM

## 2018-09-22 DIAGNOSIS — Z3400 Encounter for supervision of normal first pregnancy, unspecified trimester: Secondary | ICD-10-CM

## 2018-09-22 MED ORDER — TERCONAZOLE 0.8 % VA CREA: 1.0000 | TOPICAL_CREAM | Freq: Every day | VAGINAL | 0 refills | Status: DC
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
## 2018-09-22 NOTE — Addendum Note (Signed)
Addended by: Courtney Heys on: 09/22/2018 04:36 PM   Modules accepted: Orders

## 2018-09-22 NOTE — Progress Notes (Signed)
ROB/GTT.  TDAP declined.  C/o intermittent itching x 2 weeks.  Denies discharge, odor.

## 2018-09-23 LAB — CBC
Hematocrit: 35.5 % (ref 34.0–46.6)
Hemoglobin: 11.4 g/dL (ref 11.1–15.9)
MCH: 30.1 pg (ref 26.6–33.0)
MCHC: 32.1 g/dL (ref 31.5–35.7)
MCV: 94 fL (ref 79–97)
Platelets: 181 10*3/uL (ref 150–450)
RBC: 3.79 x10E6/uL (ref 3.77–5.28)
RDW: 12.9 % (ref 11.7–15.4)
WBC: 10.3 10*3/uL (ref 3.4–10.8)

## 2018-09-23 LAB — GLUCOSE TOLERANCE, 2 HOURS W/ 1HR
Glucose, 1 hour: 99 mg/dL (ref 65–179)
Glucose, 2 hour: 98 mg/dL (ref 65–152)
Glucose, Fasting: 75 mg/dL (ref 65–91)

## 2018-09-23 LAB — HIV ANTIBODY (ROUTINE TESTING W REFLEX): HIV Screen 4th Generation wRfx: NONREACTIVE

## 2018-09-23 LAB — RPR: RPR Ser Ql: NONREACTIVE

## 2018-09-24 LAB — CERVICOVAGINAL ANCILLARY ONLY
Bacterial vaginitis: NEGATIVE
Candida vaginitis: POSITIVE — AB
Chlamydia: NEGATIVE
Neisseria Gonorrhea: NEGATIVE
Trichomonas: NEGATIVE

## 2018-10-06 ENCOUNTER — Telehealth (INDEPENDENT_AMBULATORY_CARE_PROVIDER_SITE_OTHER): Payer: Medicaid Other | Admitting: Obstetrics

## 2018-10-06 ENCOUNTER — Encounter: Payer: Self-pay | Admitting: Obstetrics

## 2018-10-06 DIAGNOSIS — Z3403 Encounter for supervision of normal first pregnancy, third trimester: Secondary | ICD-10-CM

## 2018-10-06 DIAGNOSIS — Z3A29 29 weeks gestation of pregnancy: Secondary | ICD-10-CM

## 2018-10-06 DIAGNOSIS — Z3400 Encounter for supervision of normal first pregnancy, unspecified trimester: Secondary | ICD-10-CM

## 2018-10-06 NOTE — Progress Notes (Signed)
Pt is on the phone preparing for virtual visit with provider. [redacted]w[redacted]d.
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
## 2018-10-21 ENCOUNTER — Telehealth (INDEPENDENT_AMBULATORY_CARE_PROVIDER_SITE_OTHER): Payer: Medicaid Other | Admitting: Certified Nurse Midwife

## 2018-10-21 DIAGNOSIS — Z3400 Encounter for supervision of normal first pregnancy, unspecified trimester: Secondary | ICD-10-CM

## 2018-10-21 DIAGNOSIS — Z3A31 31 weeks gestation of pregnancy: Secondary | ICD-10-CM

## 2018-10-21 DIAGNOSIS — Z3403 Encounter for supervision of normal first pregnancy, third trimester: Secondary | ICD-10-CM

## 2018-10-21 NOTE — Progress Notes (Signed)
Pt presents for mychart visit for ROB. [redacted]w[redacted]d.
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
## 2018-10-23 ENCOUNTER — Other Ambulatory Visit: Payer: Self-pay | Admitting: Obstetrics and Gynecology

## 2018-11-21 ENCOUNTER — Ambulatory Visit (INDEPENDENT_AMBULATORY_CARE_PROVIDER_SITE_OTHER): Payer: Medicaid Other | Admitting: Nurse Practitioner

## 2018-11-21 ENCOUNTER — Encounter: Payer: Self-pay | Admitting: Nurse Practitioner

## 2018-11-21 ENCOUNTER — Other Ambulatory Visit: Payer: Self-pay

## 2018-11-21 VITALS — BP 111/71 | HR 91 | Wt 176.3 lb

## 2018-11-21 DIAGNOSIS — Z3403 Encounter for supervision of normal first pregnancy, third trimester: Secondary | ICD-10-CM

## 2018-11-21 DIAGNOSIS — Z3400 Encounter for supervision of normal first pregnancy, unspecified trimester: Secondary | ICD-10-CM

## 2018-11-21 DIAGNOSIS — Z3A35 35 weeks gestation of pregnancy: Secondary | ICD-10-CM
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
## 2018-11-21 NOTE — Patient Instructions (Signed)
Braxton Hicks Contractions Contractions of the uterus can occur throughout pregnancy, but they are not always a sign that you are in labor. You may have practice contractions called Braxton Hicks contractions. These false labor contractions are sometimes confused with true labor. What are Braxton Hicks contractions? Braxton Hicks contractions are tightening movements that occur in the muscles of the uterus before labor. Unlike true labor contractions, these contractions do not result in opening (dilation) and thinning of the cervix. Toward the end of pregnancy (32-34 weeks), Braxton Hicks contractions can happen more often and may become stronger. These contractions are sometimes difficult to tell apart from true labor because they can be very uncomfortable. You should not feel embarrassed if you go to the hospital with false labor. Sometimes, the only way to tell if you are in true labor is for your health care provider to look for changes in the cervix. The health care provider will do a physical exam and may monitor your contractions. If you are not in true labor, the exam should show that your cervix is not dilating and your water has not broken. If there are no other health problems associated with your pregnancy, it is completely safe for you to be sent home with false labor. You may continue to have Braxton Hicks contractions until you go into true labor. How to tell the difference between true labor and false labor True labor  Contractions last 30-70 seconds.  Contractions become very regular.  Discomfort is usually felt in the top of the uterus, and it spreads to the lower abdomen and low back.  Contractions do not go away with walking.  Contractions usually become more intense and increase in frequency.  The cervix dilates and gets thinner. False labor  Contractions are usually shorter and not as strong as true labor contractions.  Contractions are usually irregular.  Contractions  are often felt in the front of the lower abdomen and in the groin.  Contractions may go away when you walk around or change positions while lying down.  Contractions get weaker and are shorter-lasting as time goes on.  The cervix usually does not dilate or become thin. Follow these instructions at home:   Take over-the-counter and prescription medicines only as told by your health care provider.  Keep up with your usual exercises and follow other instructions from your health care provider.  Eat and drink lightly if you think you are going into labor.  If Braxton Hicks contractions are making you uncomfortable: ? Change your position from lying down or resting to walking, or change from walking to resting. ? Sit and rest in a tub of warm water. ? Drink enough fluid to keep your urine pale yellow. Dehydration may cause these contractions. ? Do slow and deep breathing several times an hour.  Keep all follow-up prenatal visits as told by your health care provider. This is important. Contact a health care provider if:  You have a fever.  You have continuous pain in your abdomen. Get help right away if:  Your contractions become stronger, more regular, and closer together.  You have fluid leaking or gushing from your vagina.  You pass blood-tinged mucus (bloody show).  You have bleeding from your vagina.  You have low back pain that you never had before.  You feel your baby's head pushing down and causing pelvic pressure.  Your baby is not moving inside you as much as it used to. Summary  Contractions that occur before labor are   called Braxton Hicks contractions, false labor, or practice contractions.  Braxton Hicks contractions are usually shorter, weaker, farther apart, and less regular than true labor contractions. True labor contractions usually become progressively stronger and regular, and they become more frequent.  Manage discomfort from Braxton Hicks contractions  by changing position, resting in a warm bath, drinking plenty of water, or practicing deep breathing. This information is not intended to replace advice given to you by your health care provider. Make sure you discuss any questions you have with your health care provider. Document Released: 08/09/2016 Document Revised: 03/08/2017 Document Reviewed: 08/09/2016 Elsevier Patient Education  2020 [employer] Inc.  

## 2018-11-21 NOTE — Progress Notes (Signed)
ROB.  Reports no problems today. 

## 2018-11-28 ENCOUNTER — Other Ambulatory Visit (HOSPITAL_COMMUNITY)
Admission: RE | Admit: 2018-11-28 | Discharge: 2018-11-28 | Disposition: A | Discharge status: Home / Self Care | Payer: Medicaid Other | Source: Ambulatory Visit | Attending: Women's Health | Admitting: Women's Health

## 2018-11-28 ENCOUNTER — Other Ambulatory Visit: Payer: Self-pay

## 2018-11-28 ENCOUNTER — Ambulatory Visit (INDEPENDENT_AMBULATORY_CARE_PROVIDER_SITE_OTHER): Payer: Medicaid Other | Admitting: Women's Health

## 2018-11-28 VITALS — BP 112/75 | HR 108 | Wt 177.8 lb

## 2018-11-28 DIAGNOSIS — Z3400 Encounter for supervision of normal first pregnancy, unspecified trimester: Secondary | ICD-10-CM | POA: Diagnosis present

## 2018-11-28 DIAGNOSIS — Z148 Genetic carrier of other disease: Secondary | ICD-10-CM

## 2018-11-28 DIAGNOSIS — Z3A36 36 weeks gestation of pregnancy: Secondary | ICD-10-CM

## 2018-11-28 DIAGNOSIS — Z3403 Encounter for supervision of normal first pregnancy, third trimester: Secondary | ICD-10-CM

## 2018-11-28 NOTE — Patient Instructions (Signed)
[email or web address] - FOR CONTRACEPTIVE OPTIONS   Signs and Symptoms of Labor Labor is your body's natural process of moving your baby, placenta, and umbilical cord out of your uterus. The process of labor usually starts when your baby is full-term, between 3837 and 40 weeks of pregnancy. How will I know when I am close to going into labor? As your body prepares for labor and the birth of your baby, you may notice the following symptoms in the weeks and days before true labor starts:  Having a strong desire to get your home ready to receive your new baby. This is called nesting. Nesting may be a sign that labor is approaching, and it may occur several weeks before birth. Nesting may involve cleaning and organizing your home.  Passing a small amount of thick, bloody mucus out of your vagina (normal bloody show or losing your mucus plug). This may happen more than a week before labor begins, or it might occur right before labor begins as the opening of the cervix starts to widen (dilate). For some women, the entire mucus plug passes at once. For others, smaller portions of the mucus plug may gradually pass over several days.  Your baby moving (dropping) lower in your pelvis to get into position for birth (lightening). When this happens, you may feel more pressure on your bladder and pelvic bone and less pressure on your ribs. This may make it easier to breathe. It may also cause you to need to urinate more often and have problems with bowel movements.  Having "practice contractions" (Braxton Hicks contractions) that occur at irregular (unevenly spaced) intervals that are more than 10 minutes apart. This is also called false labor. False labor contractions are common after exercise or sexual activity, and they will stop if you change position, rest, or drink fluids. These contractions are usually mild and do not get stronger over time. They may feel like: ? A backache or back pain. ? Mild cramps, similar to  menstrual cramps. ? Tightening or pressure in your abdomen. Other early symptoms that labor may be starting soon include:  Nausea or loss of appetite.  Diarrhea.  Having a sudden burst of energy, or feeling very tired.  Mood changes.  Having trouble sleeping. How will I know when labor has begun? Signs that true labor has begun may include:  Having contractions that come at regular (evenly spaced) intervals and increase in intensity. This may feel like more intense tightening or pressure in your abdomen that moves to your back. ? Contractions may also feel like rhythmic pain in your upper thighs or back that comes and goes at regular intervals. ? For first-time mothers, this change in intensity of contractions often occurs at a more gradual pace. ? Women who have given birth before may notice a more rapid progression of contraction changes.  Having a feeling of pressure in the vaginal area.  Your water breaking (rupture of membranes). This is when the sac of fluid that surrounds your baby breaks. When this happens, you will notice fluid leaking from your vagina. This may be clear or blood-tinged. Labor usually starts within 24 hours of your water breaking, but it may take longer to begin. ? Some women notice this as a gush of fluid. ? Others notice that their underwear repeatedly becomes damp. Follow these instructions at home:   When labor starts, or if your water breaks, call your health care provider or nurse care line. Based on your situation, they will determine  when you should go in for an exam.  When you are in early labor, you may be able to rest and manage symptoms at home. Some strategies to try at home include: ? Breathing and relaxation techniques. ? Taking a warm bath or shower. ? Listening to music. ? Using a heating pad on the lower back for pain. If you are directed to use heat:  Place a towel between your skin and the heat source.  Leave the heat on for 20-30  minutes.  Remove the heat if your skin turns bright red. This is especially important if you are unable to feel pain, heat, or cold. You may have a greater risk of getting burned. Get help right away if:  You have painful, regular contractions that are 5 minutes apart or less.  Labor starts before you are [redacted] weeks along in your pregnancy.  You have a fever.  You have a headache that does not go away.  You have bright red blood coming from your vagina.  You do not feel your baby moving.  You have a sudden onset of: ? Severe headache with vision problems. ? Nausea, vomiting, or diarrhea. ? Chest pain or shortness of breath. These symptoms may be an emergency. If your health care provider recommends that you go to the hospital or birth center where you plan to deliver, do not drive yourself. Have someone else drive you, or call emergency services (911 in the [place] Summary  Labor is your body's natural process of moving your baby, placenta, and umbilical cord out of your uterus.  The process of labor usually starts when your baby is full-term, between 60 and 40 weeks of pregnancy.  When labor starts, or if your water breaks, call your health care provider or nurse care line. Based on your situation, they will determine when you should go in for an exam. This information is not intended to replace advice given to you by your health care provider. Make sure you discuss any questions you have with your health care provider. Document Released: 08/31/2016 Document Revised: 12/24/2016 Document Reviewed: 08/31/2016 Elsevier Patient Education  2020 [employer].
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
## 2018-11-28 NOTE — Progress Notes (Signed)
Patient reprts fetal movement with occasional contractions.

## 2018-11-28 NOTE — Addendum Note (Signed)
Addended by: Clarisa Fling on: 11/28/2018 10:51 AM   Modules accepted: Orders

## 2018-11-30 LAB — STREP GP B NAA: Strep Gp B NAA: POSITIVE — AB

## 2018-12-01 ENCOUNTER — Encounter: Payer: Self-pay | Admitting: Women's Health

## 2018-12-01 DIAGNOSIS — B951 Streptococcus, group B, as the cause of diseases classified elsewhere: Secondary | ICD-10-CM | POA: Insufficient documentation

## 2018-12-02 LAB — CERVICOVAGINAL ANCILLARY ONLY
Chlamydia: NEGATIVE
Neisseria Gonorrhea: NEGATIVE

## 2018-12-05 ENCOUNTER — Other Ambulatory Visit: Payer: Self-pay

## 2018-12-05 ENCOUNTER — Telehealth: Payer: Medicaid Other | Admitting: Obstetrics

## 2018-12-05 ENCOUNTER — Encounter: Payer: Self-pay | Admitting: Obstetrics

## 2018-12-05 ENCOUNTER — Telehealth (INDEPENDENT_AMBULATORY_CARE_PROVIDER_SITE_OTHER): Payer: Medicaid Other | Admitting: Obstetrics

## 2018-12-05 VITALS — BP 112/78 | HR 85

## 2018-12-05 DIAGNOSIS — Z3400 Encounter for supervision of normal first pregnancy, unspecified trimester: Secondary | ICD-10-CM

## 2018-12-05 DIAGNOSIS — B951 Streptococcus, group B, as the cause of diseases classified elsewhere: Secondary | ICD-10-CM

## 2018-12-05 DIAGNOSIS — Z3A37 37 weeks gestation of pregnancy: Secondary | ICD-10-CM

## 2018-12-05 DIAGNOSIS — Z3403 Encounter for supervision of normal first pregnancy, third trimester: Secondary | ICD-10-CM

## 2018-12-05 DIAGNOSIS — Z148 Genetic carrier of other disease: Secondary | ICD-10-CM

## 2018-12-05 NOTE — Progress Notes (Signed)
Pt presents for virtual visit. Pt identified with 2 pt identifiers. She is currently [redacted]w[redacted]d. Her bp today is 112/78. She complains of having some upper abdominal pain that comes and goes throughout the day.
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
## 2018-12-08 ENCOUNTER — Ambulatory Visit (HOSPITAL_COMMUNITY): Payer: Medicaid Other | Attending: Obstetrics and Gynecology

## 2018-12-12 ENCOUNTER — Telehealth (INDEPENDENT_AMBULATORY_CARE_PROVIDER_SITE_OTHER): Payer: Medicaid Other | Admitting: Obstetrics

## 2018-12-12 ENCOUNTER — Encounter: Payer: Self-pay | Admitting: Obstetrics

## 2018-12-12 ENCOUNTER — Encounter: Payer: Medicaid Other | Admitting: Family Medicine

## 2018-12-12 VITALS — BP 115/73 | HR 83

## 2018-12-12 DIAGNOSIS — Z148 Genetic carrier of other disease: Secondary | ICD-10-CM

## 2018-12-12 DIAGNOSIS — B951 Streptococcus, group B, as the cause of diseases classified elsewhere: Secondary | ICD-10-CM

## 2018-12-12 DIAGNOSIS — Z3400 Encounter for supervision of normal first pregnancy, unspecified trimester: Secondary | ICD-10-CM

## 2018-12-12 DIAGNOSIS — Z3403 Encounter for supervision of normal first pregnancy, third trimester: Secondary | ICD-10-CM

## 2018-12-12 DIAGNOSIS — Z3A38 38 weeks gestation of pregnancy: Secondary | ICD-10-CM

## 2018-12-12 DIAGNOSIS — O99824 Streptococcus B carrier state complicating childbirth: Secondary | ICD-10-CM
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
## 2018-12-12 NOTE — Progress Notes (Signed)
I connected with  Bianca Webb on 12/12/18 by a video enabled telemedicine application and verified that I am speaking with the correct person using two identifiers.  MyChart ROB, reports no problems today.

## 2018-12-19 ENCOUNTER — Other Ambulatory Visit: Payer: Self-pay

## 2018-12-19 ENCOUNTER — Ambulatory Visit (INDEPENDENT_AMBULATORY_CARE_PROVIDER_SITE_OTHER): Payer: Medicaid Other | Admitting: Nurse Practitioner

## 2018-12-19 DIAGNOSIS — Z3400 Encounter for supervision of normal first pregnancy, unspecified trimester: Secondary | ICD-10-CM

## 2018-12-19 DIAGNOSIS — Z3403 Encounter for supervision of normal first pregnancy, third trimester: Secondary | ICD-10-CM

## 2018-12-19 DIAGNOSIS — Z3A39 39 weeks gestation of pregnancy: Secondary | ICD-10-CM

## 2018-12-19 NOTE — Progress Notes (Signed)
Patient reports fetal movement and irregular contractions. 
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
## 2018-12-22 ENCOUNTER — Telehealth (HOSPITAL_COMMUNITY): Payer: Self-pay | Admitting: *Deleted

## 2018-12-22 ENCOUNTER — Other Ambulatory Visit: Payer: Self-pay

## 2018-12-22 ENCOUNTER — Ambulatory Visit (INDEPENDENT_AMBULATORY_CARE_PROVIDER_SITE_OTHER): Payer: Medicaid Other | Admitting: Obstetrics and Gynecology

## 2018-12-22 ENCOUNTER — Encounter: Payer: Self-pay | Admitting: Obstetrics and Gynecology

## 2018-12-22 VITALS — BP 113/72 | HR 99 | Wt 180.2 lb

## 2018-12-22 DIAGNOSIS — Z3400 Encounter for supervision of normal first pregnancy, unspecified trimester: Secondary | ICD-10-CM

## 2018-12-22 DIAGNOSIS — Z3689 Encounter for other specified antenatal screening: Secondary | ICD-10-CM

## 2018-12-22 DIAGNOSIS — Z3A40 40 weeks gestation of pregnancy: Secondary | ICD-10-CM | POA: Diagnosis not present

## 2018-12-22 DIAGNOSIS — Z148 Genetic carrier of other disease: Secondary | ICD-10-CM

## 2018-12-22 DIAGNOSIS — B951 Streptococcus, group B, as the cause of diseases classified elsewhere: Secondary | ICD-10-CM

## 2018-12-22 DIAGNOSIS — O99820 Streptococcus B carrier state complicating pregnancy: Secondary | ICD-10-CM

## 2018-12-22 NOTE — Telephone Encounter (Signed)
Preadmission screen  
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
## 2018-12-22 NOTE — Progress Notes (Signed)
Patient reports fetal movement with irregular contractions. 

## 2018-12-22 NOTE — Addendum Note (Signed)
Addended by: Lewie Loron D on: 12/22/2018 02:01 PM   Modules accepted: Orders

## 2018-12-23 ENCOUNTER — Inpatient Hospital Stay (HOSPITAL_COMMUNITY)
Admission: AD | Admit: 2018-12-23 | Discharge: 2018-12-25 | DRG: 807 | Disposition: A | Discharge status: Home / Self Care | Payer: Medicaid Other | Attending: Family Medicine | Admitting: Family Medicine

## 2018-12-23 ENCOUNTER — Telehealth (HOSPITAL_COMMUNITY): Payer: Self-pay | Admitting: *Deleted

## 2018-12-23 ENCOUNTER — Ambulatory Visit (HOSPITAL_BASED_OUTPATIENT_CLINIC_OR_DEPARTMENT_OTHER)
Admission: RE | Admit: 2018-12-23 | Discharge: 2018-12-23 | Disposition: A | Discharge status: Home / Self Care | Payer: Medicaid Other | Source: Ambulatory Visit | Attending: Nurse Practitioner | Admitting: Nurse Practitioner

## 2018-12-23 ENCOUNTER — Other Ambulatory Visit: Payer: Self-pay | Admitting: Nurse Practitioner

## 2018-12-23 ENCOUNTER — Encounter (HOSPITAL_COMMUNITY): Payer: Self-pay | Admitting: *Deleted

## 2018-12-23 ENCOUNTER — Encounter: Payer: Self-pay | Admitting: Nurse Practitioner

## 2018-12-23 ENCOUNTER — Other Ambulatory Visit: Payer: Self-pay

## 2018-12-23 DIAGNOSIS — O480 Post-term pregnancy: Secondary | ICD-10-CM | POA: Diagnosis present

## 2018-12-23 DIAGNOSIS — E669 Obesity, unspecified: Secondary | ICD-10-CM | POA: Diagnosis present

## 2018-12-23 DIAGNOSIS — Z3A41 41 weeks gestation of pregnancy: Secondary | ICD-10-CM | POA: Diagnosis present

## 2018-12-23 DIAGNOSIS — B951 Streptococcus, group B, as the cause of diseases classified elsewhere: Secondary | ICD-10-CM | POA: Diagnosis present

## 2018-12-23 DIAGNOSIS — Z20828 Contact with and (suspected) exposure to other viral communicable diseases: Secondary | ICD-10-CM | POA: Diagnosis present

## 2018-12-23 DIAGNOSIS — Z148 Genetic carrier of other disease: Secondary | ICD-10-CM

## 2018-12-23 DIAGNOSIS — O403XX0 Polyhydramnios, third trimester, not applicable or unspecified: Secondary | ICD-10-CM

## 2018-12-23 DIAGNOSIS — Z3400 Encounter for supervision of normal first pregnancy, unspecified trimester: Secondary | ICD-10-CM | POA: Insufficient documentation

## 2018-12-23 DIAGNOSIS — O99824 Streptococcus B carrier state complicating childbirth: Secondary | ICD-10-CM | POA: Diagnosis present

## 2018-12-23 DIAGNOSIS — O99214 Obesity complicating childbirth: Secondary | ICD-10-CM | POA: Diagnosis present

## 2018-12-23 DIAGNOSIS — Z3A40 40 weeks gestation of pregnancy: Secondary | ICD-10-CM | POA: Diagnosis not present

## 2018-12-23 LAB — CBC
HCT: 38.8 % (ref 36.0–46.0)
Hemoglobin: 12.1 g/dL (ref 12.0–15.0)
MCH: 25.4 pg — ABNORMAL LOW (ref 26.0–34.0)
MCHC: 31.2 g/dL (ref 30.0–36.0)
MCV: 81.3 fL (ref 80.0–100.0)
Platelets: 195 10*3/uL (ref 150–400)
RBC: 4.77 MIL/uL (ref 3.87–5.11)
RDW: 16.8 % — ABNORMAL HIGH (ref 11.5–15.5)
WBC: 7.5 10*3/uL (ref 4.0–10.5)
nRBC: 0 % (ref 0.0–0.2)

## 2018-12-23 LAB — TYPE AND SCREEN
ABO/RH(D): O POS
Antibody Screen: NEGATIVE

## 2018-12-23 LAB — SARS CORONAVIRUS 2 BY RT PCR (HOSPITAL ORDER, PERFORMED IN ~~LOC~~ HOSPITAL LAB): SARS Coronavirus 2: NEGATIVE

## 2018-12-23 MED ORDER — LACTATED RINGERS IV SOLN
500.0000 mL | INTRAVENOUS | Status: DC | PRN
  Administered 2018-12-24 (×2): 500 mL via INTRAVENOUS

## 2018-12-23 MED ORDER — OXYCODONE-ACETAMINOPHEN 5-325 MG PO TABS: 2.0000 | ORAL_TABLET | ORAL | Status: DC | PRN

## 2018-12-23 MED ORDER — OXYTOCIN BOLUS FROM INFUSION
500.0000 mL | Freq: Once | INTRAVENOUS | Status: AC
  Administered 2018-12-24: 500 mL via INTRAVENOUS

## 2018-12-23 MED ORDER — MISOPROSTOL 50MCG HALF TABLET
50.0000 ug | ORAL_TABLET | ORAL | Status: DC | PRN
  Administered 2018-12-23: 19:00:00 50 ug via ORAL
  Filled 2018-12-23: qty 1

## 2018-12-23 MED ORDER — OXYCODONE-ACETAMINOPHEN 5-325 MG PO TABS: 1.0000 | ORAL_TABLET | ORAL | Status: DC | PRN

## 2018-12-23 MED ORDER — ACETAMINOPHEN 325 MG PO TABS: 650.0000 mg | ORAL_TABLET | ORAL | Status: DC | PRN

## 2018-12-23 MED ORDER — FENTANYL CITRATE (PF) 100 MCG/2ML IJ SOLN
50.0000 ug | Freq: Once | INTRAMUSCULAR | Status: AC
  Administered 2018-12-23: 23:00:00 50 ug via INTRAVENOUS

## 2018-12-23 MED ORDER — LACTATED RINGERS IV SOLN
INTRAVENOUS | Status: DC
  Administered 2018-12-23 – 2018-12-24 (×3): via INTRAVENOUS

## 2018-12-23 MED ORDER — TERBUTALINE SULFATE 1 MG/ML IJ SOLN: 0.2500 mg | Freq: Once | INTRAMUSCULAR | Status: DC | PRN

## 2018-12-23 MED ORDER — MISOPROSTOL 25 MCG QUARTER TABLET: 25.0000 ug | ORAL_TABLET | ORAL | Status: DC | PRN

## 2018-12-23 MED ORDER — SOD CITRATE-CITRIC ACID 500-334 MG/5ML PO SOLN: 30.0000 mL | ORAL | Status: DC | PRN

## 2018-12-23 MED ORDER — MISOPROSTOL 25 MCG QUARTER TABLET
ORAL_TABLET | ORAL | Status: AC
  Administered 2018-12-23: 25 ug
  Filled 2018-12-23: qty 1

## 2018-12-23 MED ORDER — SODIUM CHLORIDE 0.9 % IV SOLN
5.0000 10*6.[IU] | Freq: Once | INTRAVENOUS | Status: AC
  Administered 2018-12-23: 5 10*6.[IU] via INTRAVENOUS
  Filled 2018-12-23: qty 5

## 2018-12-23 MED ORDER — PENICILLIN G 3 MILLION UNITS IVPB - SIMPLE MED
3.0000 10*6.[IU] | INTRAVENOUS | Status: DC
  Administered 2018-12-23 – 2018-12-24 (×4): 3 10*6.[IU] via INTRAVENOUS
  Filled 2018-12-23 (×4): qty 100

## 2018-12-23 MED ORDER — ONDANSETRON HCL 4 MG/2ML IJ SOLN: 4.0000 mg | Freq: Four times a day (QID) | INTRAMUSCULAR | Status: DC | PRN

## 2018-12-23 MED ORDER — FLEET ENEMA 7-19 GM/118ML RE ENEM: 1.0000 | ENEMA | RECTAL | Status: DC | PRN

## 2018-12-23 MED ORDER — OXYTOCIN 40 UNITS IN NORMAL SALINE INFUSION - SIMPLE MED: 2.5000 [IU]/h | INTRAVENOUS | Status: DC

## 2018-12-23 MED ORDER — FENTANYL CITRATE (PF) 100 MCG/2ML IJ SOLN
INTRAMUSCULAR | Status: AC
  Filled 2018-12-23: qty 2

## 2018-12-23 MED ORDER — OXYTOCIN 40 UNITS IN NORMAL SALINE INFUSION - SIMPLE MED
1.0000 m[IU]/min | INTRAVENOUS | Status: DC
  Administered 2018-12-24: 03:00:00 2 m[IU]/min via INTRAVENOUS
  Filled 2018-12-23: qty 1000

## 2018-12-23 MED ORDER — LIDOCAINE HCL (PF) 1 % IJ SOLN: 30.0000 mL | INTRAMUSCULAR | Status: DC | PRN
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
## 2018-12-23 NOTE — Progress Notes (Signed)
Received report from charge RN.

## 2018-12-23 NOTE — Telephone Encounter (Signed)
Preadmission screen  

## 2018-12-23 NOTE — Progress Notes (Signed)
Labor Progress Note KISSY CIELO is a 18 y.o. G1P0000 at [redacted]w[redacted]d presented for polyhydramnios. S: Some cramping but overall comfortable.   O:  BP 122/74   Pulse 83   Temp 98.9 F (37.2 C) (Oral)   Resp 16   Ht 5\' 2"  (1.575 m)   Wt 82.1 kg   LMP 03/17/2018 (Exact Date)   BMI 33.12 kg/m  EFM: 150, reactive, moderate variability, pos accels, no decels Ctx: Q2-65m  CVE: Dilation: 2 Effacement (%): 50 Cervical Position: Posterior Station: -3 Presentation: Vertex Exam by:: Dr. Marice Potter    A&P: 18 y.o. G1P0000 [redacted]w[redacted]d here for IOL for polyhydramnios. #Labor: Progressing well. Second Cytotec given at 2245. Attempted Foley and patient unable to tolerate. Repeat SVE in 4 hours or as clinically indicated. #Pain: IV pain meds or Epidural when desires #FWB: Cat I #GBS positive on PCN since [id number]  Chauncey Mann, MD 10:52 PM

## 2018-12-24 ENCOUNTER — Encounter (HOSPITAL_COMMUNITY): Payer: Self-pay

## 2018-12-24 ENCOUNTER — Inpatient Hospital Stay (HOSPITAL_COMMUNITY): Payer: Medicaid Other | Admitting: Anesthesiology

## 2018-12-24 DIAGNOSIS — O480 Post-term pregnancy: Secondary | ICD-10-CM

## 2018-12-24 DIAGNOSIS — O403XX0 Polyhydramnios, third trimester, not applicable or unspecified: Secondary | ICD-10-CM

## 2018-12-24 DIAGNOSIS — O99824 Streptococcus B carrier state complicating childbirth: Secondary | ICD-10-CM

## 2018-12-24 DIAGNOSIS — Z3A40 40 weeks gestation of pregnancy: Secondary | ICD-10-CM

## 2018-12-24 LAB — RPR: RPR Ser Ql: NONREACTIVE

## 2018-12-24 MED ORDER — SODIUM CHLORIDE (PF) 0.9 % IJ SOLN
INTRAMUSCULAR | Status: DC | PRN
  Administered 2018-12-24: 12 mL/h via EPIDURAL

## 2018-12-24 MED ORDER — DIPHENHYDRAMINE HCL 50 MG/ML IJ SOLN: 12.5000 mg | INTRAMUSCULAR | Status: DC | PRN

## 2018-12-24 MED ORDER — FENTANYL CITRATE (PF) 100 MCG/2ML IJ SOLN
100.0000 ug | INTRAMUSCULAR | Status: DC | PRN
  Administered 2018-12-24: 100 ug via INTRAVENOUS

## 2018-12-24 MED ORDER — EPHEDRINE 5 MG/ML INJ: 10.0000 mg | INTRAVENOUS | Status: DC | PRN

## 2018-12-24 MED ORDER — DIPHENHYDRAMINE HCL 25 MG PO CAPS: 25.0000 mg | ORAL_CAPSULE | Freq: Four times a day (QID) | ORAL | Status: DC | PRN

## 2018-12-24 MED ORDER — SIMETHICONE 80 MG PO CHEW: 80.0000 mg | CHEWABLE_TABLET | ORAL | Status: DC | PRN

## 2018-12-24 MED ORDER — FENTANYL CITRATE (PF) 100 MCG/2ML IJ SOLN
INTRAMUSCULAR | Status: AC
  Filled 2018-12-24: qty 2

## 2018-12-24 MED ORDER — LIDOCAINE HCL (PF) 1 % IJ SOLN
INTRAMUSCULAR | Status: DC | PRN
  Administered 2018-12-24: 3 mL via EPIDURAL
  Administered 2018-12-24: 2 mL via EPIDURAL
  Administered 2018-12-24: 5 mL via EPIDURAL

## 2018-12-24 MED ORDER — BENZOCAINE-MENTHOL 20-0.5 % EX AERO
1.0000 "application " | INHALATION_SPRAY | CUTANEOUS | Status: DC | PRN
  Administered 2018-12-24: 1 via TOPICAL
  Filled 2018-12-24: qty 56

## 2018-12-24 MED ORDER — FENTANYL-BUPIVACAINE-NACL 0.5-0.125-0.9 MG/250ML-% EP SOLN
12.0000 mL/h | EPIDURAL | Status: DC | PRN
  Filled 2018-12-24: qty 250

## 2018-12-24 MED ORDER — ZOLPIDEM TARTRATE 5 MG PO TABS: 5.0000 mg | ORAL_TABLET | Freq: Every evening | ORAL | Status: DC | PRN

## 2018-12-24 MED ORDER — SENNOSIDES-DOCUSATE SODIUM 8.6-50 MG PO TABS
2.0000 | ORAL_TABLET | ORAL | Status: DC
  Administered 2018-12-24: 23:00:00 2 via ORAL
  Filled 2018-12-24: qty 2

## 2018-12-24 MED ORDER — LACTATED RINGERS AMNIOINFUSION
INTRAVENOUS | Status: DC
  Administered 2018-12-24: 06:00:00 via INTRAUTERINE

## 2018-12-24 MED ORDER — PHENYLEPHRINE 40 MCG/ML (10ML) SYRINGE FOR IV PUSH (FOR BLOOD PRESSURE SUPPORT)
80.0000 ug | PREFILLED_SYRINGE | INTRAVENOUS | Status: DC | PRN
  Filled 2018-12-24: qty 10

## 2018-12-24 MED ORDER — TETANUS-DIPHTH-ACELL PERTUSSIS 5-2.5-18.5 LF-MCG/0.5 IM SUSP: 0.5000 mL | Freq: Once | INTRAMUSCULAR | Status: DC

## 2018-12-24 MED ORDER — WITCH HAZEL-GLYCERIN EX PADS: 1.0000 "application " | MEDICATED_PAD | CUTANEOUS | Status: DC | PRN

## 2018-12-24 MED ORDER — LACTATED RINGERS IV SOLN: 500.0000 mL | Freq: Once | INTRAVENOUS | Status: DC

## 2018-12-24 MED ORDER — IBUPROFEN 600 MG PO TABS
600.0000 mg | ORAL_TABLET | Freq: Four times a day (QID) | ORAL | Status: DC
  Administered 2018-12-24 – 2018-12-25 (×4): 600 mg via ORAL
  Filled 2018-12-24 (×6): qty 1

## 2018-12-24 MED ORDER — ACETAMINOPHEN 325 MG PO TABS: 650.0000 mg | ORAL_TABLET | ORAL | Status: DC | PRN

## 2018-12-24 MED ORDER — PRENATAL MULTIVITAMIN CH
1.0000 | ORAL_TABLET | Freq: Every day | ORAL | Status: DC
  Filled 2018-12-24: qty 1

## 2018-12-24 MED ORDER — PHENYLEPHRINE 40 MCG/ML (10ML) SYRINGE FOR IV PUSH (FOR BLOOD PRESSURE SUPPORT): 80.0000 ug | PREFILLED_SYRINGE | INTRAVENOUS | Status: DC | PRN

## 2018-12-24 MED ORDER — ONDANSETRON HCL 4 MG PO TABS: 4.0000 mg | ORAL_TABLET | ORAL | Status: DC | PRN

## 2018-12-24 MED ORDER — ONDANSETRON HCL 4 MG/2ML IJ SOLN: 4.0000 mg | INTRAMUSCULAR | Status: DC | PRN

## 2018-12-24 MED ORDER — DIBUCAINE (PERIANAL) 1 % EX OINT: 1.0000 "application " | TOPICAL_OINTMENT | CUTANEOUS | Status: DC | PRN

## 2018-12-24 MED ORDER — COCONUT OIL OIL: 1.0000 "application " | TOPICAL_OIL | Status: DC | PRN

## 2018-12-24 MED ORDER — LACTATED RINGERS IV SOLN
500.0000 mL | Freq: Once | INTRAVENOUS | Status: AC
  Administered 2018-12-24: 500 mL via INTRAVENOUS

## 2018-12-24 NOTE — Progress Notes (Signed)
Labor Progress Note Bianca Webb is a 18 y.o. G1P0000 at [redacted]w[redacted]d presented for polyhydramnios. S: Some abdominal pain but more comfortable on side.  O:  BP 117/62   Pulse 93   Temp 99.2 F (37.3 C) (Oral)   Resp 18   Ht 5\' 2"  (1.575 m)   Wt 82.1 kg   LMP 03/17/2018 (Exact Date)   SpO2 100%   BMI 33.12 kg/m  EFM: 150, reactive, moderate variability, pos accels, occasional deep variables  Ctx: Q2-32m  CVE: Dilation: 4 Effacement (%): 50 Cervical Position: Middle Station: -2 Presentation: Vertex Exam by:: Jeannette Corpus, RN)   A&P: 18 y.o. G1P0000 [redacted]w[redacted]d here for IOL for polyhydramnios. #Labor: Progressing well. S/p 2 doses Cytotec. Pitocin paused due to variables. IUPC placed and amnioinfusion started.  #Pain: Epidural  #FWB: Cat II #GBS positive on PCN since [id number]  Chauncey Mann, MD 6:19 AM

## 2018-12-24 NOTE — Progress Notes (Signed)
Bianca Webb is a 18 y.o. G1P0000 at [redacted]w[redacted]d admitted for induction of labor due to polyhydramnios.  Subjective: Resting comfortably with epidural. No complaints.  Objective: BP (!) 99/53   Pulse 81   Temp 98.4 F (36.9 C) (Oral)   Resp 18   Ht 5\' 2"  (1.575 m)   Wt 82.1 kg   LMP 03/17/2018 (Exact Date)   SpO2 100%   BMI 33.12 kg/m  No intake/output data recorded.  FHT:  FHR: 150 bpm, variability: moderate,  accelerations:  Abscent,  decelerations:  Present variables v lates, some deep decels UC:   regular, every q2-3 minutes  SVE:   Dilation: 8 Effacement (%): 90 Station: 0 Exam by:: Armanda Forand  Pitocin @ 2 mu/min  Labs: Lab Results  Component Value Date   WBC 7.5 12/23/2018   HGB 12.1 12/23/2018   HCT 38.8 12/23/2018   MCV 81.3 12/23/2018   PLT 195 12/23/2018    Assessment / Plan: 18 yo G1P0000 at 40.2 EGA who was admitted for IOL 2/2 polyhydramnios.  Labor: s/p cyto x2, SROM, IUPC, FSE now placed. Pitocin at 2. Making excellent cervical change. Fetal Wellbeing:  Category II. Variability is moderate, indicating low risk for fetal acidemia.  Pain Control:  Epidural I/D:  GBS +, on PCN Anticipated MOD:  NSVD, CS as appropriate  Merilyn Baba DO OB Fellow, Faculty Practice 12/24/2018, 10:16 AM

## 2018-12-24 NOTE — Anesthesia Procedure Notes (Signed)
Epidural Patient location during procedure: OB Start time: 12/24/2018 4:27 AM End time: 12/24/2018 4:34 AM  Staffing Anesthesiologist: Catalina Gravel, MD Performed: anesthesiologist   Preanesthetic Checklist Completed: patient identified, pre-op evaluation, timeout performed, IV checked, risks and benefits discussed and monitors and equipment checked  Epidural Patient position: sitting Prep: DuraPrep Patient monitoring: blood pressure and continuous pulse ox Approach: midline Location: L3-L4 Injection technique: LOR air  Needle:  Needle type: Tuohy  Needle gauge: 17 G Needle length: 9 cm Needle insertion depth: 7 cm Catheter size: 19 Gauge Catheter at skin depth: 12 cm Test dose: negative and Other (1% Lidocaine)  Additional Notes Patient identified.  Risk benefits discussed including failed block, incomplete pain control, headache, nerve damage, paralysis, blood pressure changes, nausea, vomiting, reactions to medication both toxic or allergic, and postpartum back pain.  Patient expressed understanding and wished to proceed.  All questions were answered.  Sterile technique used throughout procedure and epidural site dressed with sterile barrier dressing. No paresthesia or other complications noted. The patient did not experience any signs of intravascular injection such as tinnitus or metallic taste in mouth nor signs of intrathecal spread such as rapid motor block. Please see nursing notes for vital signs. Reason for block:procedure for pain

## 2018-12-24 NOTE — Progress Notes (Signed)
Bianca Webb is a 18 y.o. G1P0000 at [redacted]w[redacted]d admitted for induction of labor due to polyhydramnios.  Subjective:   Objective: BP 122/78   Pulse 98   Temp 97.6 F (36.4 C) (Oral)   Resp 18   Ht 5\' 2"  (1.575 m)   Wt 82.1 kg   LMP 03/17/2018 (Exact Date)   SpO2 100%   BMI 33.12 kg/m  No intake/output data recorded.  FHT:  FHR: 170 bpm, variability: moderate,  accelerations:  Abscent,  decelerations:  Present multiple variables, some lates UC:   regular, every 2-5 minutes  SVE:   Dilation: Lip/rim Effacement (%): 90 Station: 0 Exam by:: Ikey Omary  Pitocin @ 2 mu/min  Labs: Lab Results  Component Value Date   WBC 7.5 12/23/2018   HGB 12.1 12/23/2018   HCT 38.8 12/23/2018   MCV 81.3 12/23/2018   PLT 195 12/23/2018    Assessment / Plan: 18 yo G1P0000 at 40.2 EGA who was admitted for IOL 2/2 polyhydramnios.  Labor: s/p cyto x2, SROM, IUPC, FSE. Pitocin at 2. Making excellent cervical change. Fetal Wellbeing:  Category II. Some fetal tachycardia, but variability is moderate, indicating low risk for fetal acidemia. Pain Control:  Epidural I/D:  GBS +, on PCN Anticipated MOD:  NSVD, CS as appropriate  Bianca Baba DO OB Fellow, Faculty Practice 12/24/2018, 11:47 AM

## 2018-12-24 NOTE — Anesthesia Preprocedure Evaluation (Signed)
Anesthesia Evaluation  Patient identified by MRN, date of birth, ID band Patient awake    Reviewed: Allergy & Precautions, NPO status , Patient's Chart, lab work & pertinent test results  Airway Mallampati: II  TM Distance: >3 FB Neck ROM: Full    Dental  (+) Teeth Intact, Dental Advisory Given   Pulmonary neg pulmonary ROS,    Pulmonary exam normal breath sounds clear to auscultation       Cardiovascular negative cardio ROS Normal cardiovascular exam Rhythm:Regular Rate:Normal     Neuro/Psych negative neurological ROS     GI/Hepatic negative GI ROS, Neg liver ROS,   Endo/Other  Obesity   Renal/GU negative Renal ROS     Musculoskeletal negative musculoskeletal ROS (+)   Abdominal   Peds  Hematology negative hematology ROS (+) Plt 195k   Anesthesia Other Findings Day of surgery medications reviewed with the patient.  Reproductive/Obstetrics (+) Pregnancy                             Anesthesia Physical Anesthesia Plan  ASA: II  Anesthesia Plan: Epidural   Post-op Pain Management:    Induction:   PONV Risk Score and Plan: 2 and Treatment may vary due to age or medical condition  Airway Management Planned: Natural Airway  Additional Equipment:   Intra-op Plan:   Post-operative Plan:   Informed Consent: I have reviewed the patients History and Physical, chart, labs and discussed the procedure including the risks, benefits and alternatives for the proposed anesthesia with the patient or authorized representative who has indicated his/her understanding and acceptance.     Dental advisory given  Plan Discussed with:   Anesthesia Plan Comments: (Patient identified. Risks/Benefits/Options discussed with patient including but not limited to bleeding, infection, nerve damage, paralysis, failed block, incomplete pain control, headache, blood pressure changes, nausea, vomiting,  reactions to medication both or allergic, itching and postpartum back pain. Confirmed with bedside nurse the patient's most recent platelet count. Confirmed with patient that they are not currently taking any anticoagulation, have any bleeding history or any family history of bleeding disorders. Patient expressed understanding and wished to proceed. All questions were answered. )        Anesthesia Quick Evaluation

## 2018-12-24 NOTE — Progress Notes (Signed)
Labor Progress Note Bianca Webb is a 18 y.o. G1P0000 at [redacted]w[redacted]d presented for polyhydramnios. S: Some cramping but overall comfortable.   O:  BP (!) 105/59   Pulse 93   Temp 97.6 F (36.4 C) (Axillary)   Resp 16   Ht 5\' 2"  (1.575 m)   Wt 82.1 kg   LMP 03/17/2018 (Exact Date)   BMI 33.12 kg/m  EFM: 150, reactive, moderate variability, pos accels, one variable in last 10 minutes Ctx: Q2-27m  CVE: Dilation: 3 Effacement (%): 50 Cervical Position: Posterior Station: -3 Presentation: Vertex Exam by:: Jeannette Corpus, RN    A&P: 18 y.o. G1P0000 [redacted]w[redacted]d here for IOL for polyhydramnios. #Labor: Progressing well. S/p 2 doses Cytotec. Will start Pitocin #Pain: IV pain meds or Epidural when desires #FWB: Cat II #GBS positive on PCN since 1610  Chauncey Mann, MD 2:53 AM

## 2018-12-24 NOTE — Discharge Summary (Addendum)
OB Discharge Summary     Patient Name: Bianca Webb DOB: 09/08/2000 MRN: [id number]  Date of admission: 12/23/2018 Delivering MD: Lyndel SafeNEWTON, KIMBERLY NILES   Date of discharge: 12/25/2018  Admitting diagnosis: Pregnancy  Intrauterine pregnancy: 5349w2d     Secondary diagnosis:  Active Problems:   Positive GBS test   Polyhydramnios affecting pregnancy in third trimester   Post term pregnancy at [redacted] weeks gestation  Additional problems:      Discharge diagnosis: Term Pregnancy Delivered                                                                                                Post partum procedures:none  Augmentation: Pitocin and Cytotec  Complications: None  Hospital course:  Induction of Labor With Vaginal Delivery   18 y.o. yo G1P0000 at 4949w2d was admitted to the hospital 12/23/2018 for induction of labor.  Indication for induction: polyhydramnios.  Patient had an uncomplicated labor course as follows: Membrane Rupture Time/Date: 3:30 AM ,12/24/2018   Intrapartum Procedures: Episiotomy: None [1]                                         Lacerations:  1st degree [2];2nd degree [3]  Patient had delivery of a Viable infant.  Information for the patient's newborn:  Jae DireSwindell, Girl Saylor [[id number]][[id number]]  Delivery Method: Vaginal, Spontaneous(Filed from Delivery Summary)    12/24/2018  Details of delivery can be found in separate delivery note.  Patient had a routine postpartum course. Patient is discharged home 12/25/18.  Physical exam  Vitals:   12/24/18 1802 12/24/18 2300 12/25/18 0300 12/25/18 0630  BP: 115/73 108/76 109/70 105/72  Pulse: 88 72 (!) 103 100  Resp: 18 17 16 15   Temp: 97.6 F (36.4 C) 97.9 F (36.6 C) 98 F (36.7 C) 98.1 F (36.7 C)  TempSrc: Oral Oral Oral Oral  SpO2: 100% 100% 100% 100%  Weight:      Height:       General: alert, cooperative and no distress Lochia: appropriate Uterine Fundus: firm Incision: N/A DVT Evaluation: No evidence of  DVT seen on physical exam. Labs: Lab Results  Component Value Date   WBC 15.1 (H) 12/25/2018   HGB 9.3 (L) 12/25/2018   HCT 28.2 (L) 12/25/2018   MCV 80.8 12/25/2018   PLT 149 (L) 12/25/2018   CMP Latest Ref Rng & Units 01/26/2017  Glucose 65 - 99 mg/dL 79  BUN 6 - 20 mg/dL 78(G21(H)  Creatinine 9.560.44 - 1.00 mg/dL 2.130.77  Sodium 086135 - 578145 mmol/L 139  Potassium 3.5 - 5.1 mmol/L 3.4(L)  Chloride 101 - 111 mmol/L 108  CO2 22 - 32 mmol/L 22  Calcium 8.9 - 10.3 mg/dL 9.0  Total Protein 6.5 - 8.1 g/dL -  Total Bilirubin 0.3 - 1.2 mg/dL -  Alkaline Phos 47 - 469119 U/L -  AST 15 - 41 U/L -  ALT 14 - 54 U/L -    Discharge instruction: per After Visit Summary and "Baby  and Me Booklet".  After visit meds:  Allergies as of 12/25/2018   No Known Allergies     Medication List    TAKE these medications   Comfort Fit Maternity Supp Med Misc 1 Device by Does not apply route daily.   ferrous sulfate 325 (65 FE) MG tablet Take 1 tablet (325 mg total) by mouth daily with breakfast.   Prenatal Gummies/DHA & FA 0.4-32.5 MG Chew Chew 1 each by mouth daily.       Diet: routine diet  Activity: Advance as tolerated. Pelvic rest for 6 weeks.   Outpatient follow up:6 weeks Follow up Appt: Future Appointments  Date Time Provider [place]  01/29/2019  1:00 PM Lajean Manes, CNM [facility] None   Follow up Visit:No follow-ups on file.  Postpartum contraception: None  Newborn Data: Live born female  Birth Weight: 3490 g APGAR: 47, 9  Newborn Delivery   Birth date/time: 12/24/2018 15:03:00 Delivery type: Vaginal, Spontaneous     Please schedule this patient for Postpartum visit in: 6 weeks with the following provider: Any provider For C/S patients schedule nurse incision check in weeks 2 weeks: no High risk pregnancy complicated by: Polyhydramnios Delivery mode:  SVD Anticipated Birth Control:  other/unsure PP Procedures needed: none  Schedule Integrated [place] visit:  no   Baby Feeding: Bottle Disposition:home with mother or rooming in if baby needs to stay one more day   12/25/2018 Chauncey Mann, MD

## 2018-12-25 LAB — CBC
HCT: 28.2 % — ABNORMAL LOW (ref 36.0–46.0)
Hemoglobin: 9.3 g/dL — ABNORMAL LOW (ref 12.0–15.0)
MCH: 26.6 pg (ref 26.0–34.0)
MCHC: 33 g/dL (ref 30.0–36.0)
MCV: 80.8 fL (ref 80.0–100.0)
Platelets: 149 10*3/uL — ABNORMAL LOW (ref 150–400)
RBC: 3.49 MIL/uL — ABNORMAL LOW (ref 3.87–5.11)
RDW: 17.2 % — ABNORMAL HIGH (ref 11.5–15.5)
WBC: 15.1 10*3/uL — ABNORMAL HIGH (ref 4.0–10.5)
nRBC: 0 % (ref 0.0–0.2)

## 2018-12-25 MED ORDER — FERROUS SULFATE 325 (65 FE) MG PO TABS
325.0000 mg | ORAL_TABLET | Freq: Every day | ORAL | Status: DC
  Administered 2018-12-25: 325 mg via ORAL
  Filled 2018-12-25: qty 1

## 2018-12-25 MED ORDER — FERROUS SULFATE 325 (65 FE) MG PO TABS: 325.0000 mg | ORAL_TABLET | Freq: Every day | ORAL | 1 refills | Status: DC

## 2018-12-25 NOTE — Anesthesia Postprocedure Evaluation (Signed)
Anesthesia Post Note  Patient: Bianca Webb  Procedure(s) Performed: AN AD HOC LABOR EPIDURAL     Patient location during evaluation: Mother Baby Anesthesia Type: Epidural Level of consciousness: awake, awake and alert and oriented Pain management: pain level controlled Vital Signs Assessment: post-procedure vital signs reviewed and stable Respiratory status: spontaneous breathing, nonlabored ventilation and respiratory function stable Cardiovascular status: stable and blood pressure returned to baseline Postop Assessment: no headache, no backache, patient able to bend at knees, no apparent nausea or vomiting, adequate PO intake and able to ambulate Anesthetic complications: no    Last Vitals:  Vitals:   12/25/18 0300 12/25/18 0630  BP: 109/70 105/72  Pulse: (!) 103 100  Resp: 16 15  Temp: 36.7 C 36.7 C  SpO2: 100% 100%    Last Pain:  Vitals:   12/25/18 0630  TempSrc: Oral  PainSc: 0-No pain   Pain Goal:                   Lloyd Cullinan

## 2018-12-27 ENCOUNTER — Other Ambulatory Visit (HOSPITAL_COMMUNITY)
Admission: RE | Admit: 2018-12-27 | Discharge: 2018-12-27 | Disposition: A | Discharge status: Home / Self Care | Payer: Medicaid Other | Source: Ambulatory Visit | Attending: Pediatrics | Admitting: Pediatrics

## 2018-12-29 ENCOUNTER — Inpatient Hospital Stay (HOSPITAL_COMMUNITY): Payer: Medicaid Other

## 2018-12-29 ENCOUNTER — Inpatient Hospital Stay (HOSPITAL_COMMUNITY)
Admission: AD | Admit: 2018-12-29 | Payer: Medicaid Other | Source: Home / Self Care | Admitting: Obstetrics and Gynecology

## 2019-01-29 ENCOUNTER — Ambulatory Visit (INDEPENDENT_AMBULATORY_CARE_PROVIDER_SITE_OTHER): Payer: Medicaid Other | Admitting: Certified Nurse Midwife

## 2019-01-29 ENCOUNTER — Encounter: Payer: Self-pay | Admitting: Certified Nurse Midwife

## 2019-01-29 ENCOUNTER — Other Ambulatory Visit: Payer: Self-pay

## 2019-01-29 DIAGNOSIS — Z1389 Encounter for screening for other disorder: Secondary | ICD-10-CM

## 2019-01-29 DIAGNOSIS — Z3009 Encounter for other general counseling and advice on contraception: Secondary | ICD-10-CM

## 2019-01-29 NOTE — Progress Notes (Signed)
Post Partum Exam  Bianca Webb is a 18 y.o. G32P1001 female who presents for a postpartum visit. She is 4 weeks postpartum following a spontaneous vaginal delivery. I have fully reviewed the prenatal and intrapartum course. The delivery was at 40 gestational weeks.  Anesthesia: epidural. Postpartum course has been Unremarkable. Baby's course has been Unremarkable. Baby is feeding by Bianca Webb . Bleeding no bleeding. Bowel function is normal. Bladder function is normal. Patient is sexually active. Contraception method is none. Postpartum depression screening:neg EPDS : 1  The following portions of the patient's history were reviewed and updated as appropriate: allergies, current medications, past medical history, past surgical history and problem list. Last pap smear done N/A Due to age.  Review of Systems A comprehensive review of systems was negative.    Objective:  unknown if currently breastfeeding.  General:  alert, cooperative and no distress   Breasts:  inspection negative, no nipple discharge or bleeding, no masses or nodularity palpable  Lungs: clear to auscultation bilaterally  Heart:  regular rate and rhythm  Abdomen: soft, non-tender; bowel sounds normal; no masses,  no organomegaly   Vulva:  not evaluated  Vagina: not evaluated  Cervix:  not evaluated  Corpus: not examined  Adnexa:  not evaluated  Rectal Exam: Not performed.        Assessment/Plan:  1. Postpartum care and examination - Normal postpartum exam. Pap smear not done at today's visit.   2. Birth control counseling - Patient recently had IC 2-3 days ago, unable to initiate birth control today  - Educated and discussed birth control options in detail including LARC methods.  - Patient is considering OCPs vs IUD- is concerned with migration of IUD, discussed low chance of migration - brochure given so that patient can review and make decision.   Follow up in 2 weeks for IUD placement or initiation of  [name], CNM 01/29/19, 4:59 PM

## 2019-02-12 ENCOUNTER — Ambulatory Visit (INDEPENDENT_AMBULATORY_CARE_PROVIDER_SITE_OTHER): Payer: Medicaid Other

## 2019-02-12 ENCOUNTER — Other Ambulatory Visit: Payer: Self-pay

## 2019-02-12 VITALS — BP 106/69 | HR 77 | Wt 161.5 lb

## 2019-02-12 DIAGNOSIS — Z3202 Encounter for pregnancy test, result negative: Secondary | ICD-10-CM

## 2019-02-12 DIAGNOSIS — Z30011 Encounter for initial prescription of contraceptive pills: Secondary | ICD-10-CM | POA: Diagnosis not present

## 2019-02-12 LAB — POCT URINE PREGNANCY: Preg Test, Ur: NEGATIVE

## 2019-02-12 MED ORDER — LEVONORGEST-ETH ESTRAD 91-DAY 0.1-0.02 & 0.01 MG PO TABS: 1.0000 | ORAL_TABLET | Freq: Every day | ORAL | 4 refills | Status: DC

## 2019-02-12 NOTE — Progress Notes (Signed)
    GYNECOLOGY OFFICE VISIT NOTE  History:   Bianca Webb [id number] here today for birth control initiation. She reports that she is currently using condoms and would like to start birth control pills.  She states she has taken pills in the past with good results and had no issues with compliance.  She states she has used depo provera in the past, but does not desire that method currently stating she had continuous bleeding.  She denies any abnormal vaginal discharge, bleeding, pelvic pain or pain/discomfort during sexual activity.   Patient denies history of migraines with aura or high blood pressure.  She states she does not desire a monthly period.   History reviewed. No pertinent past medical history.  Past Surgical History:  Procedure Laterality Date  . NO PAST SURGERIES      The following portions of the patient's history were reviewed and updated as appropriate: allergies, current medications, past family history, past medical history, past social history, past surgical history and problem list.   Health Maintenance:  No pap history d/t age.  No mammogram history d/t age.   Review of Systems:  Pertinent items noted in HPI and remainder of comprehensive ROS otherwise negative.    Objective:    Physical Exam BP 106/69   Pulse 77   Wt 161 lb 8 oz (73.3 kg)   BMI 29.54 kg/m  Physical Exam Constitutional:      Appearance: Normal appearance.  Eyes:     Conjunctiva/sclera: Conjunctivae normal.  Neck:     Musculoskeletal: Normal range of motion.  Cardiovascular:     Rate and Rhythm: Normal rate.  Pulmonary:     Effort: Pulmonary effort is normal.  Musculoskeletal: Normal range of motion.  Neurological:     Mental Status: She is alert and oriented to person, place, and time.  Psychiatric:        Mood and Affect: Mood normal.        Thought Content: Thought content normal.      Labs and Imaging Results for orders placed or performed in visit on 02/12/19 (from the  past 168 hour(s))  POCT urine pregnancy   Collection Time: 02/12/19  3:18 PM  Result Value Ref Range   Preg Test, Ur Negative Negative   No results found.   Assessment & Plan:       1. Encounter for BCP (birth control pills) initial prescription -Reviewed effectiveness, risks, and benefits of oral contraception. -Discussed timing of administration and back up methods. -Reviewed symptoms to report to provider including onset of HA and/or migraines with aura, chest pain, leg pain, or SOB. -Encouraged to start pills immediately and utilize condoms x 7 days. -Plan for follow up in 3 months for BP check.  - POCT urine pregnancy   Routine preventative health maintenance measures emphasized. Please refer to After Visit Summary for other counseling recommendations.   Return in about 3 months (around 05/15/2019) for Nurse Visit for Blood Pressure Check s/p OCP Initiation.      Maryann Conners, CNM 02/12/2019

## 2019-02-12 NOTE — Progress Notes (Signed)
Pt requests BCP.  S/p SVD 12/24/2018 No LMP since delivery.

## 2019-02-12 NOTE — Patient Instructions (Signed)
Oral Contraception Use Oral contraceptive pills (OCPs) are medicines that you take to prevent pregnancy. OCPs work by:  Preventing the ovaries from releasing eggs.  Thickening mucus in the lower part of the uterus (cervix), which prevents sperm from entering the uterus.  Thinning the lining of the uterus (endometrium), which prevents a fertilized egg from attaching to the endometrium. OCPs are highly effective when taken exactly as prescribed. However, OCPs do not prevent sexually transmitted infections (STIs). Safe sex practices, such as using condoms while on an OCP, can help prevent STIs. Before taking OCPs, you may have a physical exam, blood test, and Pap test. A Pap test involves taking a sample of cells from your cervix to check for cancer. Discuss with your health care provider the possible side effects of the OCP you may be prescribed. When you start an OCP, be aware that it can take 2-3 months for your body to adjust to changes in hormone levels. How to take oral contraceptive pills Follow instructions from your health care provider about how to start taking your first cycle of OCPs. Your health care provider may recommend that you:  Start the pill on day 1 of your menstrual period. If you start at this time, you will not need any backup form of birth control (contraception), such as condoms.  Start the pill on the first Sunday after your menstrual period or on the day you get your prescription. In these cases, you will need to use backup contraception for the first week.  Start the pill at any time of your cycle. ? If you take the pill within 5 days of the start of your period, you will not need a backup form of contraception. ? If you start at any other time of your menstrual cycle, you will need to use another form of contraception for 7 days. If your OCP is the type called a minipill, it will protect you from pregnancy after taking it for 2 days (48 hours), and you can stop using  backup contraception after that time. After you have started taking OCPs:  If you forget to take 1 pill, take it as soon as you remember. Take the next pill at the regular time.  If you miss 2 or more pills, call your health care provider. Different pills have different instructions for missed doses. Use backup birth control until your next menstrual period starts.  If you use a 28-day pack that contains inactive pills and you miss 1 of the last 7 pills (pills with no hormones), throw away the rest of the non-hormone pills and start a new pill pack. No matter which day you start the OCP, you will always start a new pack on that same day of the week. Have an extra pack of OCPs and a backup contraceptive method available in case you miss some pills or lose your OCP pack. Follow these instructions at home:  Do not use any products that contain nicotine or tobacco, such as cigarettes and e-cigarettes. If you need help quitting, ask your health care provider.  Always use a condom to protect against STIs. OCPs do not protect against STIs.  Use a calendar to mark the days of your menstrual period.  Read the information and directions that came with your OCP. Talk to your health care provider if you have questions. Contact a health care provider if:  You develop nausea and vomiting.  You have abnormal vaginal discharge or bleeding.  You develop a rash.    You miss your menstrual period. Depending on the type of OCP you are taking, this may be a sign of pregnancy. Ask your health care provider for more information.  You are losing your hair.  You need treatment for mood swings or depression.  You get dizzy when taking the OCP.  You develop acne after taking the OCP.  You become pregnant or think you may be pregnant.  You have diarrhea, constipation, and abdominal pain or cramps.  You miss 2 or more pills. Get help right away if:  You develop chest pain.  You develop shortness of  breath.  You have an uncontrolled or severe headache.  You develop numbness or slurred speech.  You develop visual or speech problems.  You develop pain, redness, and swelling in your legs.  You develop weakness or numbness in your arms or legs. Summary  Oral contraceptive pills (OCPs) are medicines that you take to prevent pregnancy.  OCPs do not prevent sexually transmitted infections (STIs). Always use a condom to protect against STIs.  When you start an OCP, be aware that it can take 2-3 months for your body to adjust to changes in hormone levels.  Read all the information and directions that come with your OCP. This information is not intended to replace advice given to you by your health care provider. Make sure you discuss any questions you have with your health care provider. Document Released: 03/15/2011 Document Revised: 07/18/2018 Document Reviewed: 05/07/2016 Elsevier Patient Education  2020 [employer]  

## 2019-05-18 ENCOUNTER — Other Ambulatory Visit: Payer: Self-pay

## 2019-05-18 ENCOUNTER — Ambulatory Visit (INDEPENDENT_AMBULATORY_CARE_PROVIDER_SITE_OTHER): Payer: Medicaid Other

## 2019-05-18 VITALS — BP 112/70 | HR 75

## 2019-05-18 DIAGNOSIS — Z0130 Encounter for examination of blood pressure without abnormal findings: Secondary | ICD-10-CM | POA: Diagnosis not present

## 2019-05-18 NOTE — Progress Notes (Signed)
Subjective:  Bianca Webb is a 19 y.o. female here for BP check.   Hypertension ROS: not taking medications regularly as instructed, patient does not perform home BP monitoring, no chest pain on exertion, no dyspnea on exertion, no swelling of ankles and no palpitations.    Objective:  There were no vitals taken for this visit.  Appearance alert, well appearing, and in no distress. General exam BP noted to be well controlled today in office.    Assessment:   Blood Pressure well controlled.   Plan:  Current treatment plan is effective, no change in therapy.Marland Kitchen

## 2019-06-02 ENCOUNTER — Ambulatory Visit (HOSPITAL_COMMUNITY)
Admission: EM | Admit: 2019-06-02 | Discharge: 2019-06-02 | Disposition: A | Discharge status: Home / Self Care | Payer: Medicaid Other | Attending: Urgent Care | Admitting: Urgent Care

## 2019-06-02 ENCOUNTER — Other Ambulatory Visit: Payer: Self-pay

## 2019-06-02 ENCOUNTER — Encounter (HOSPITAL_COMMUNITY): Payer: Self-pay

## 2019-06-02 ENCOUNTER — Ambulatory Visit (HOSPITAL_COMMUNITY)
Admission: EM | Admit: 2019-06-02 | Discharge: 2019-06-02 | Disposition: A | Discharge status: Home / Self Care | Payer: Medicaid Other | Attending: Family Medicine | Admitting: Family Medicine

## 2019-06-02 DIAGNOSIS — Z113 Encounter for screening for infections with a predominantly sexual mode of transmission: Secondary | ICD-10-CM | POA: Diagnosis not present

## 2019-06-02 DIAGNOSIS — Z3202 Encounter for pregnancy test, result negative: Secondary | ICD-10-CM | POA: Diagnosis not present

## 2019-06-02 LAB — POC URINE PREG, ED: Preg Test, Ur: NEGATIVE

## 2019-06-02 LAB — POCT PREGNANCY, URINE: Preg Test, Ur: NEGATIVE

## 2019-06-02 NOTE — ED Triage Notes (Signed)
Pt will return at her appt time at 430

## 2019-06-02 NOTE — ED Triage Notes (Signed)
Patient presents to Urgent Care with complaints of requesting a pregnancy test and STD test. Patient reports she has not missed her period yet; LMP 05/19/2019, did not take any pregnancy tests at home.

## 2019-06-02 NOTE — Discharge Instructions (Signed)
We will call you with positive results of your lab work. Negative results can be found on your MyChart.
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
## 2019-06-04 LAB — CERVICOVAGINAL ANCILLARY ONLY
Chlamydia: NEGATIVE
Neisseria Gonorrhea: NEGATIVE
Trichomonas: NEGATIVE

## 2019-06-23 ENCOUNTER — Encounter (HOSPITAL_COMMUNITY): Payer: Self-pay

## 2019-06-23 ENCOUNTER — Ambulatory Visit (HOSPITAL_COMMUNITY)
Admission: EM | Admit: 2019-06-23 | Discharge: 2019-06-23 | Disposition: A | Discharge status: Home / Self Care | Payer: Medicaid Other | Attending: Family Medicine | Admitting: Family Medicine

## 2019-06-23 ENCOUNTER — Other Ambulatory Visit: Payer: Self-pay

## 2019-06-23 DIAGNOSIS — N926 Irregular menstruation, unspecified: Secondary | ICD-10-CM

## 2019-06-23 DIAGNOSIS — Z3202 Encounter for pregnancy test, result negative: Secondary | ICD-10-CM

## 2019-06-23 DIAGNOSIS — J3089 Other allergic rhinitis: Secondary | ICD-10-CM

## 2019-06-23 DIAGNOSIS — H9202 Otalgia, left ear: Secondary | ICD-10-CM

## 2019-06-23 LAB — POCT PREGNANCY, URINE: Preg Test, Ur: NEGATIVE

## 2019-06-23 LAB — POC URINE PREG, ED: Preg Test, Ur: NEGATIVE

## 2019-06-23 MED ORDER — CETIRIZINE HCL 10 MG PO TABS: 10.0000 mg | ORAL_TABLET | Freq: Every day | ORAL | 2 refills | Status: DC
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
## 2019-06-23 NOTE — Discharge Instructions (Signed)
Take Zyrtec daily for allergic rhinitis.   Your pregnancy test was negative.   Follow up as needed.

## 2019-06-23 NOTE — ED Triage Notes (Signed)
Pt c/o left ear pain, nasal congestion, sore throatx5 days. Pt states she also wants a pregnancy test while here, because she missed her menstrual.

## 2019-06-30 IMAGING — US US OB TRANSVAGINAL
1 of 2 series · 13 of 28 positions shown · non-contrast
Comparison: Pelvic ultrasound dated 02/09/2017.

CLINICAL DATA: Pelvic pain for the past 2 days. Six weeks and 6
days pregnant by last menstrual period. Quantitative beta HCG
[DATE].

EXAM:
OBSTETRIC <14 WK US AND TRANSVAGINAL OB US
TECHNIQUE: Both transabdominal and transvaginal ultrasound examinations were
performed for complete evaluation of the gestation as well as the
maternal uterus, adnexal regions, and pelvic cul-de-sac.
Transvaginal technique was performed to assess early pregnancy.

[Series 1: us ob transvaginal · 0.20mm/px · 82 acquisitions, 13 frames shown]
[im 4/82]
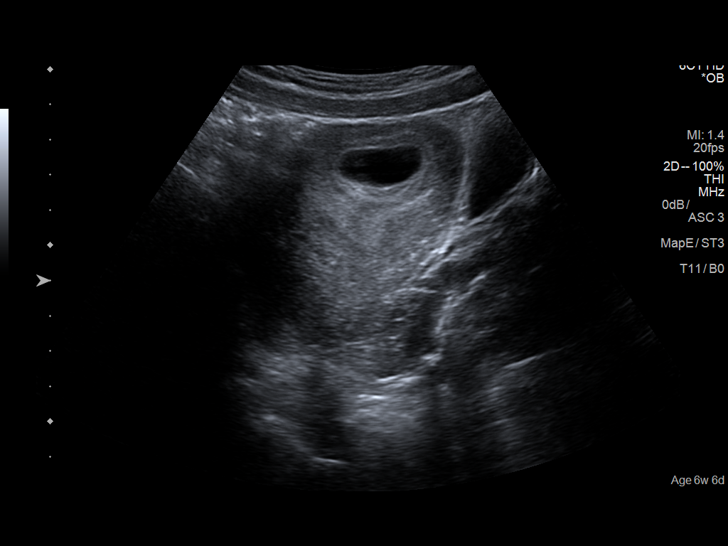
[im 10/82]
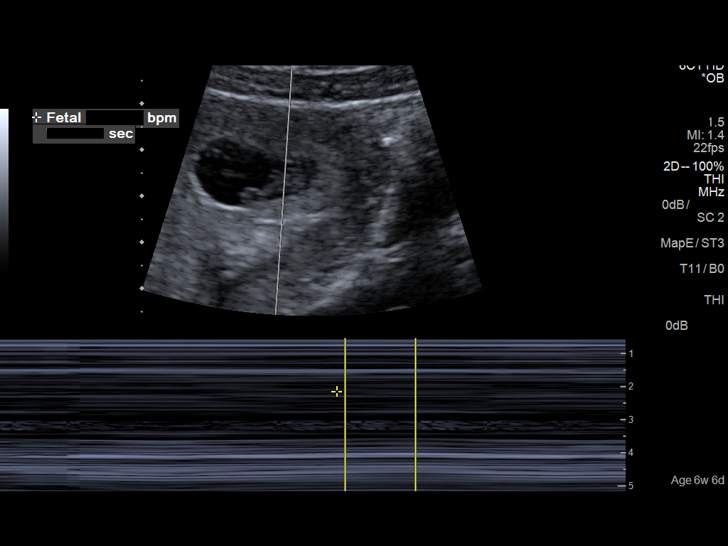
[im 16/82]
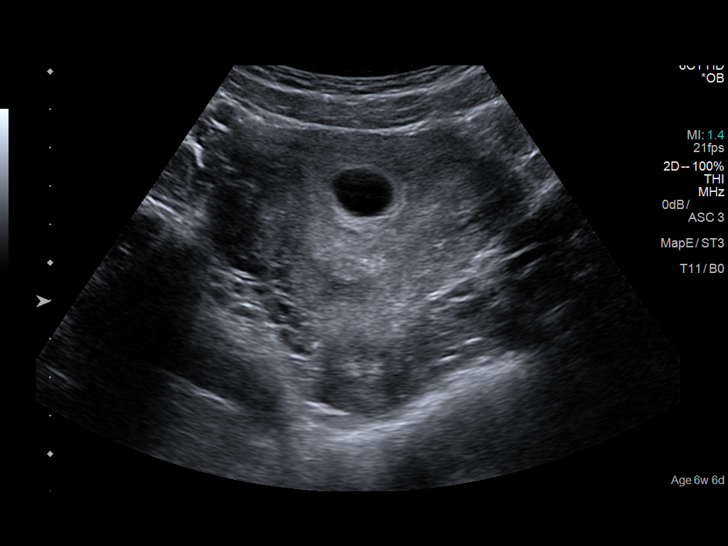
[im 22/82]
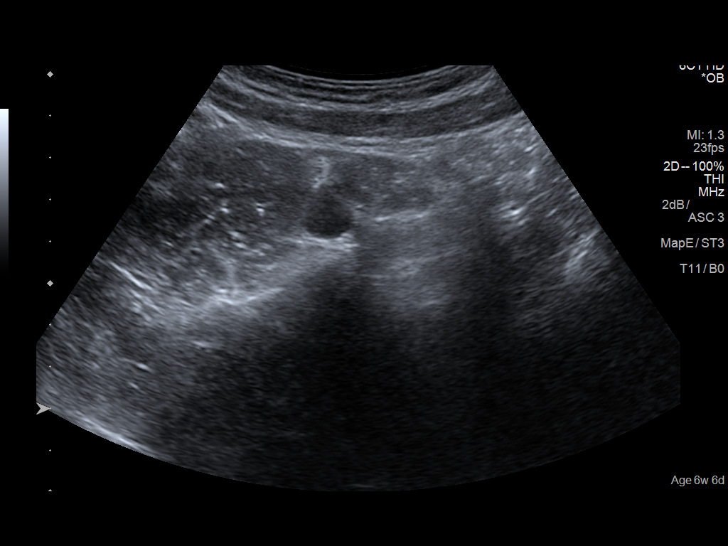
[im 29/82]
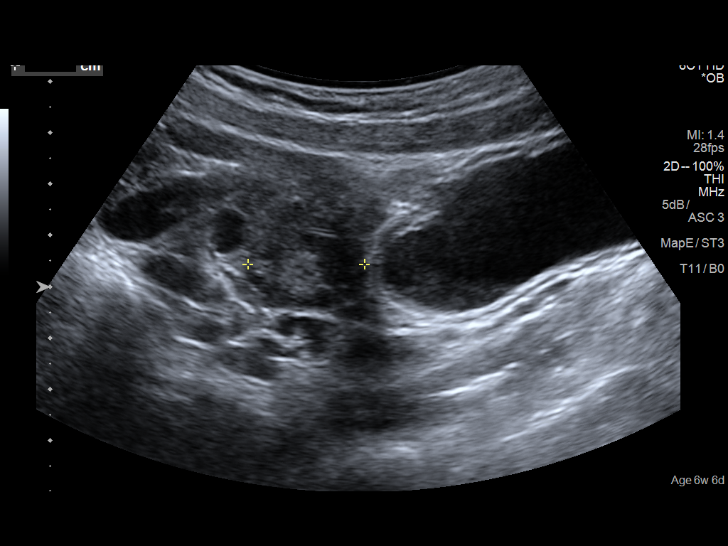
[im 35/82]
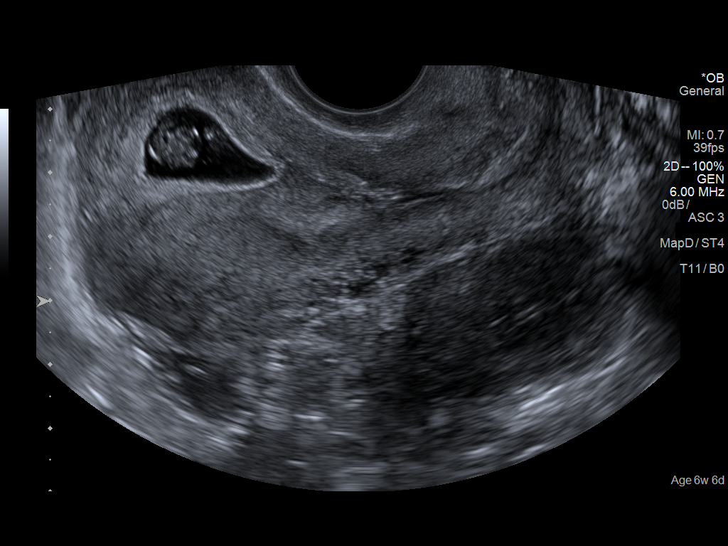
[im 44/82]
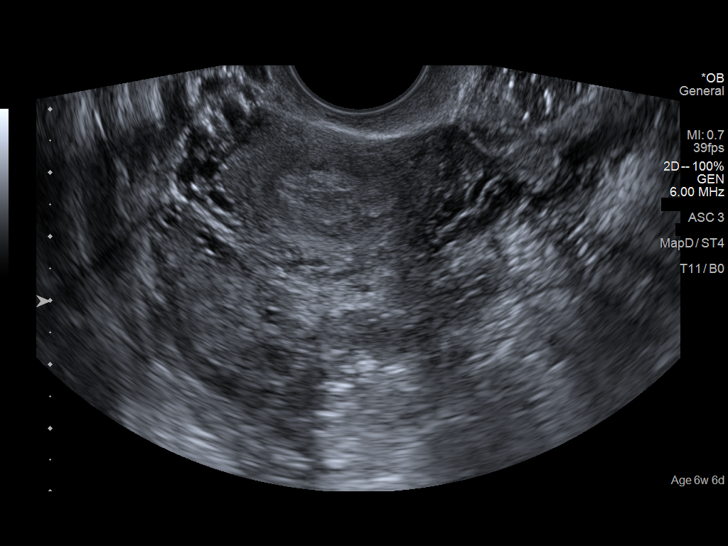
[im 50/82]
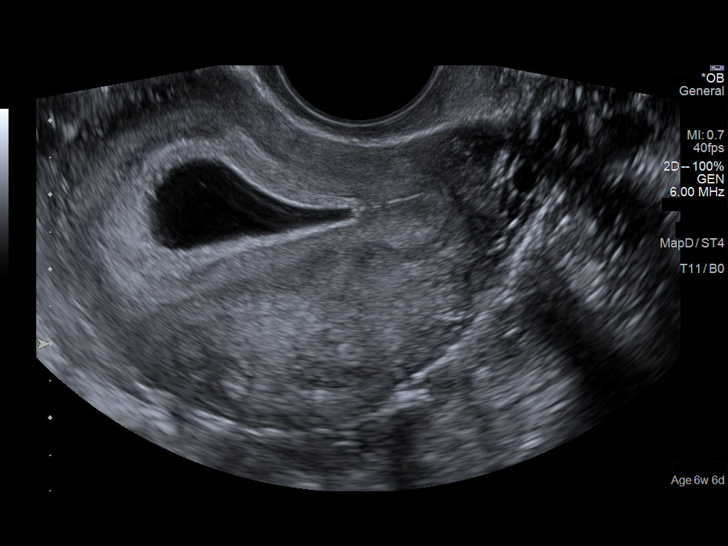
[im 57/82]
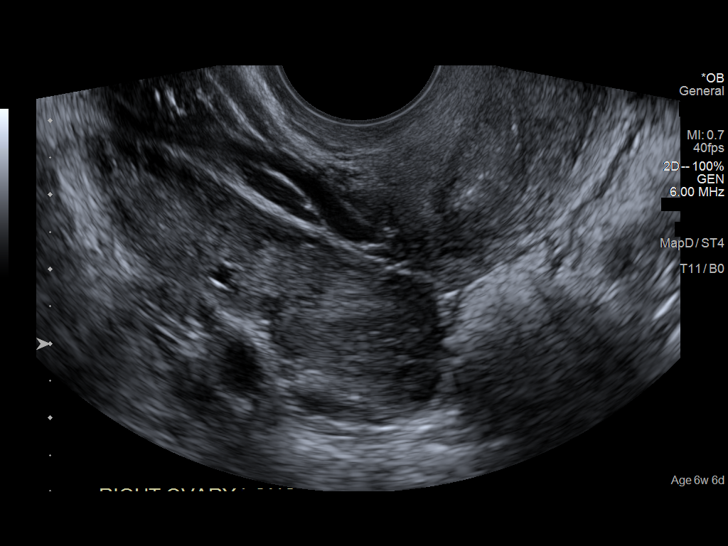
[im 63/82]
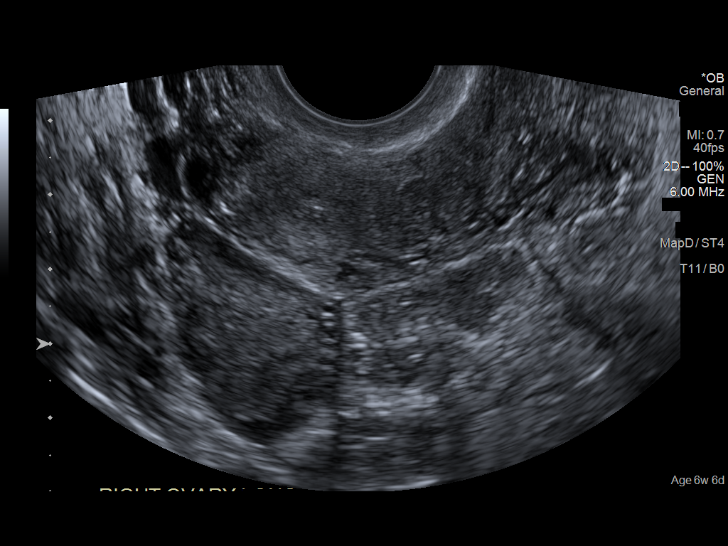
[im 69/82]
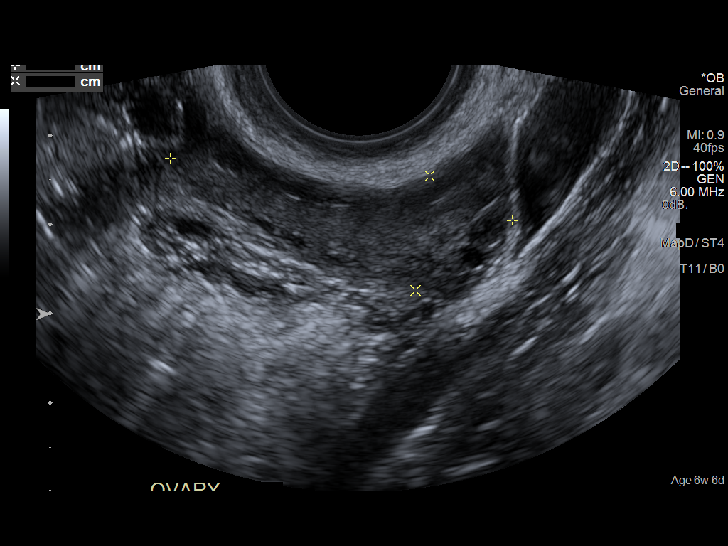
[im 75/82]
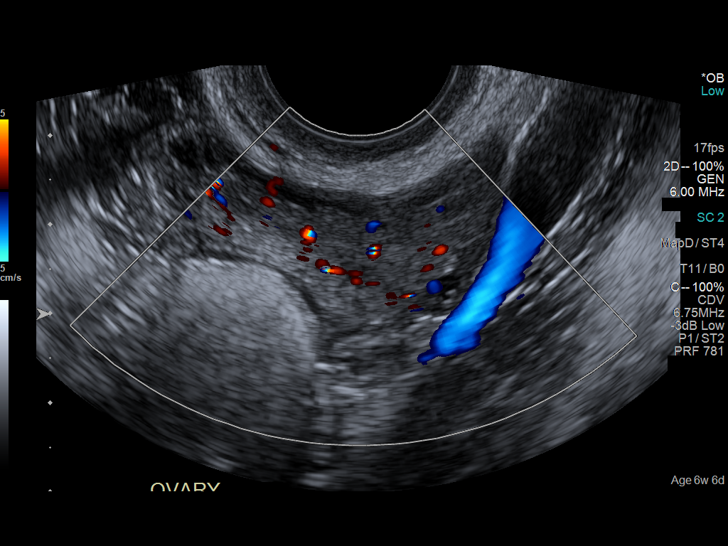
[im 82/82]
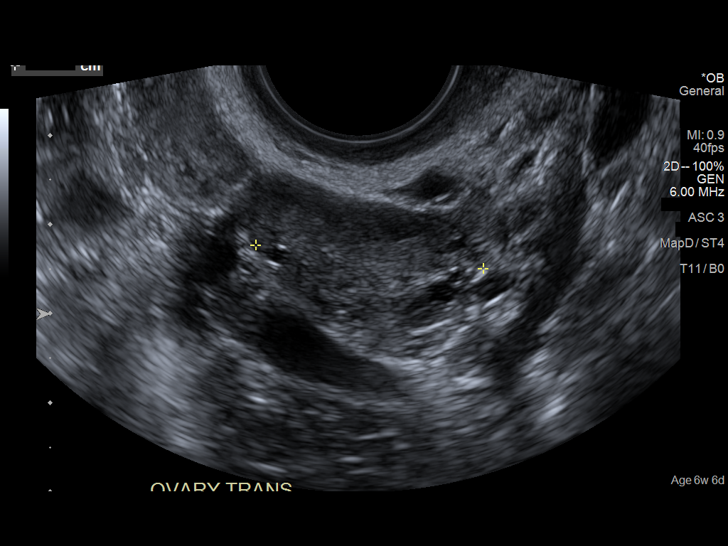

[13 of 28 positions shown; findings below may reference images not displayed]

FINDINGS: Intrauterine gestational sac: Visualized

Yolk sac:  Visualized

Embryo:  Visualized

Cardiac Activity: Visualized

Heart Rate: 145 bpm

CRL:  10.0 mm   7 w   0 d                  US EDC: 12/21/2018

Subchorionic hemorrhage:  None visualized.

Maternal uterus/adnexae: Interval 2.1 x 1.9 x 1.8 cm oval,
circumscribed, slightly hypoechoic mass in the right ovary with some
internal blood flow with color Doppler. The previously demonstrated
complex cystic mass in the right ovary is no longer seen. The left
ovary has a normal appearance. No free peritoneal fluid.
IMPRESSION: 1. Single live intrauterine gestation with an estimated gestational
age of 7 weeks and 0 days.
2. No subchorionic hemorrhage.
3. Interval 2.1 cm solid-appearing right ovarian mass. This may
represent an atypical appearing corpus luteum or other mass. A
follow-up ultrasound to evaluate the right ovary is recommended in 2
months.

## 2019-08-29 ENCOUNTER — Emergency Department (HOSPITAL_COMMUNITY): Payer: Medicaid Other

## 2019-08-29 ENCOUNTER — Other Ambulatory Visit: Payer: Self-pay

## 2019-08-29 ENCOUNTER — Encounter (HOSPITAL_COMMUNITY): Payer: Self-pay | Admitting: Emergency Medicine

## 2019-08-29 ENCOUNTER — Emergency Department (HOSPITAL_COMMUNITY)
Admission: EM | Admit: 2019-08-29 | Discharge: 2019-08-29 | Disposition: A | Discharge status: Home / Self Care | Payer: Medicaid Other | Attending: Emergency Medicine | Admitting: Emergency Medicine

## 2019-08-29 DIAGNOSIS — Y999 Unspecified external cause status: Secondary | ICD-10-CM | POA: Insufficient documentation

## 2019-08-29 DIAGNOSIS — Z79899 Other long term (current) drug therapy: Secondary | ICD-10-CM | POA: Diagnosis not present

## 2019-08-29 DIAGNOSIS — S8992XA Unspecified injury of left lower leg, initial encounter: Secondary | ICD-10-CM | POA: Diagnosis present

## 2019-08-29 DIAGNOSIS — R519 Headache, unspecified: Secondary | ICD-10-CM | POA: Insufficient documentation

## 2019-08-29 DIAGNOSIS — Y9389 Activity, other specified: Secondary | ICD-10-CM | POA: Diagnosis not present

## 2019-08-29 DIAGNOSIS — S20311A Abrasion of right front wall of thorax, initial encounter: Secondary | ICD-10-CM | POA: Insufficient documentation

## 2019-08-29 DIAGNOSIS — R55 Syncope and collapse: Secondary | ICD-10-CM | POA: Diagnosis not present

## 2019-08-29 DIAGNOSIS — R1010 Upper abdominal pain, unspecified: Secondary | ICD-10-CM | POA: Insufficient documentation

## 2019-08-29 DIAGNOSIS — R0789 Other chest pain: Secondary | ICD-10-CM | POA: Diagnosis not present

## 2019-08-29 DIAGNOSIS — S8002XA Contusion of left knee, initial encounter: Secondary | ICD-10-CM | POA: Diagnosis not present

## 2019-08-29 DIAGNOSIS — Y92410 Unspecified street and highway as the place of occurrence of the external cause: Secondary | ICD-10-CM | POA: Insufficient documentation

## 2019-08-29 DIAGNOSIS — R52 Pain, unspecified: Secondary | ICD-10-CM

## 2019-08-29 LAB — COMPREHENSIVE METABOLIC PANEL
ALT: 16 U/L (ref 0–44)
AST: 23 U/L (ref 15–41)
Albumin: 3.9 g/dL (ref 3.5–5.0)
Alkaline Phosphatase: 60 U/L (ref 38–126)
Anion gap: 10 (ref 5–15)
BUN: 21 mg/dL — ABNORMAL HIGH (ref 6–20)
CO2: 20 mmol/L — ABNORMAL LOW (ref 22–32)
Calcium: 8.9 mg/dL (ref 8.9–10.3)
Chloride: 108 mmol/L (ref 98–111)
Creatinine, Ser: 0.84 mg/dL (ref 0.44–1.00)
GFR calc Af Amer: >60 mL/min (ref >60)
GFR calc non Af Amer: >60 mL/min (ref >60)
Glucose, Bld: 105 mg/dL — ABNORMAL HIGH (ref 70–99)
Potassium: 3.4 mmol/L — ABNORMAL LOW (ref 3.5–5.1)
Sodium: 138 mmol/L (ref 135–145)
Total Bilirubin: 0.8 mg/dL (ref 0.3–1.2)
Total Protein: 6.4 g/dL — ABNORMAL LOW (ref 6.5–8.1)

## 2019-08-29 LAB — CBC WITH DIFFERENTIAL/PLATELET
Abs Immature Granulocytes: 0.02 10*3/uL (ref 0.00–0.07)
Basophils Absolute: 0 10*3/uL (ref 0.0–0.1)
Basophils Relative: 1 %
Eosinophils Absolute: 0 10*3/uL (ref 0.0–0.5)
Eosinophils Relative: 1 %
HCT: 40.3 % (ref 36.0–46.0)
Hemoglobin: 12.8 g/dL (ref 12.0–15.0)
Immature Granulocytes: 0 %
Lymphocytes Relative: 38 %
Lymphs Abs: 2.9 10*3/uL (ref 0.7–4.0)
MCH: 28.6 pg (ref 26.0–34.0)
MCHC: 31.8 g/dL (ref 30.0–36.0)
MCV: 90.2 fL (ref 80.0–100.0)
Monocytes Absolute: 0.5 10*3/uL (ref 0.1–1.0)
Monocytes Relative: 7 %
Neutro Abs: 4.1 10*3/uL (ref 1.7–7.7)
Neutrophils Relative %: 53 %
Platelets: 156 10*3/uL (ref 150–400)
RBC: 4.47 MIL/uL (ref 3.87–5.11)
RDW: 15.1 % (ref 11.5–15.5)
WBC: 7.7 10*3/uL (ref 4.0–10.5)
nRBC: 0 % (ref 0.0–0.2)

## 2019-08-29 LAB — I-STAT CHEM 8, ED
BUN: 21 mg/dL — ABNORMAL HIGH (ref 6–20)
Calcium, Ion: 1.21 mmol/L (ref 1.15–1.40)
Chloride: 109 mmol/L (ref 98–111)
Creatinine, Ser: 0.7 mg/dL (ref 0.44–1.00)
Glucose, Bld: 105 mg/dL — ABNORMAL HIGH (ref 70–99)
HCT: 40 % (ref 36.0–46.0)
Hemoglobin: 13.6 g/dL (ref 12.0–15.0)
Potassium: 3.4 mmol/L — ABNORMAL LOW (ref 3.5–5.1)
Sodium: 140 mmol/L (ref 135–145)
TCO2: 23 mmol/L (ref 22–32)

## 2019-08-29 LAB — I-STAT BETA HCG BLOOD, ED (MC, WL, AP ONLY): I-stat hCG, quantitative: <5 m[IU]/mL (ref <5)

## 2019-08-29 MED ORDER — IOHEXOL 300 MG/ML  SOLN
100.0000 mL | Freq: Once | INTRAMUSCULAR | Status: AC | PRN
  Administered 2019-08-29: 100 mL via INTRAVENOUS

## 2019-08-29 MED ORDER — HYDROCODONE-ACETAMINOPHEN 7.5-325 MG/15ML PO SOLN
10.0000 mL | Freq: Once | ORAL | Status: AC
  Administered 2019-08-29: 10 mL via ORAL
  Filled 2019-08-29: qty 15

## 2019-08-29 NOTE — Discharge Instructions (Addendum)
If you develop new or worsening headache, vomiting, vision changes, neck pain, or any other new/concerning symptoms then return to the ER for evaluation.  Otherwise, take ibuprofen and/or Tylenol for your pain and discomfort.
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
## 2019-08-29 NOTE — ED Provider Notes (Signed)
CT images personally reviewed. No acute injuries.  Feels generally all over sore. Some headache, but no vomiting, vision changes, etc. C-collar removed, no neck pain. Recommend NSAIDs and tylenol. Return precautions discussed.    Pricilla Loveless, MD 08/29/19 [phone]

## 2019-08-29 NOTE — ED Notes (Signed)
Pt taken to CT.

## 2019-08-29 NOTE — ED Notes (Signed)
Pt taken to xray 

## 2020-01-22 ENCOUNTER — Encounter (HOSPITAL_COMMUNITY): Payer: Self-pay | Admitting: Emergency Medicine

## 2020-01-22 ENCOUNTER — Other Ambulatory Visit: Payer: Self-pay

## 2020-01-22 ENCOUNTER — Ambulatory Visit (HOSPITAL_COMMUNITY)
Admission: EM | Admit: 2020-01-22 | Discharge: 2020-01-22 | Disposition: A | Discharge status: Home / Self Care | Payer: Medicaid Other | Attending: Family Medicine | Admitting: Family Medicine

## 2020-01-22 DIAGNOSIS — Z113 Encounter for screening for infections with a predominantly sexual mode of transmission: Secondary | ICD-10-CM | POA: Diagnosis not present

## 2020-01-22 DIAGNOSIS — R112 Nausea with vomiting, unspecified: Secondary | ICD-10-CM | POA: Insufficient documentation

## 2020-01-22 DIAGNOSIS — Z3202 Encounter for pregnancy test, result negative: Secondary | ICD-10-CM

## 2020-01-22 LAB — POC URINE PREG, ED: Preg Test, Ur: NEGATIVE

## 2020-01-22 LAB — POCT URINALYSIS DIPSTICK, ED / UC
Bilirubin Urine: NEGATIVE
Glucose, UA: NEGATIVE mg/dL
Hgb urine dipstick: NEGATIVE
Ketones, ur: NEGATIVE mg/dL
Leukocytes,Ua: NEGATIVE
Nitrite: NEGATIVE
Protein, ur: NEGATIVE mg/dL
Specific Gravity, Urine: 1.025 (ref 1.005–1.030)
Urobilinogen, UA: 0.2 mg/dL (ref 0.0–1.0)
pH: 5.5 (ref 5.0–8.0)

## 2020-01-22 LAB — HIV ANTIBODY (ROUTINE TESTING W REFLEX): HIV Screen 4th Generation wRfx: NONREACTIVE

## 2020-01-22 MED ORDER — ONDANSETRON 4 MG PO TBDP: 4.0000 mg | ORAL_TABLET | Freq: Three times a day (TID) | ORAL | 0 refills | Status: DC | PRN
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
## 2020-01-22 NOTE — ED Triage Notes (Addendum)
Patient c/o possible exposure to STD.   Patient c/o "not feeling so good". Patient has begun to vomit while this writer attempted to triage. Patient denies any urinary symptoms , changes in discharge, or pelvic pain.

## 2020-01-22 NOTE — Discharge Instructions (Addendum)
Your urine was negative for pregnancy and infection STD testing pending.  You can check my chart for results.  Zofran as needed Drink plenty of fluids.

## 2020-01-23 LAB — RPR: RPR Ser Ql: NONREACTIVE

## 2020-01-25 ENCOUNTER — Telehealth (HOSPITAL_COMMUNITY): Payer: Self-pay | Admitting: Emergency Medicine

## 2020-01-25 LAB — CERVICOVAGINAL ANCILLARY ONLY
Bacterial Vaginitis (gardnerella): POSITIVE — AB
Candida Glabrata: NEGATIVE
Candida Vaginitis: NEGATIVE
Chlamydia: NEGATIVE
Comment: NEGATIVE
Comment: NEGATIVE
Comment: NEGATIVE
Comment: NEGATIVE
Comment: NEGATIVE
Comment: NORMAL
Neisseria Gonorrhea: NEGATIVE
Trichomonas: NEGATIVE

## 2020-01-25 MED ORDER — METRONIDAZOLE 500 MG PO TABS: 500.0000 mg | ORAL_TABLET | Freq: Two times a day (BID) | ORAL | 0 refills | Status: DC

## 2020-02-24 ENCOUNTER — Ambulatory Visit (HOSPITAL_COMMUNITY)
Admission: EM | Admit: 2020-02-24 | Discharge: 2020-02-24 | Disposition: A | Discharge status: Home / Self Care | Payer: Medicaid Other | Attending: Family Medicine | Admitting: Family Medicine

## 2020-02-24 ENCOUNTER — Other Ambulatory Visit: Payer: Self-pay

## 2020-02-24 ENCOUNTER — Encounter (HOSPITAL_COMMUNITY): Payer: Self-pay

## 2020-02-24 DIAGNOSIS — Z3202 Encounter for pregnancy test, result negative: Secondary | ICD-10-CM | POA: Insufficient documentation

## 2020-02-24 LAB — HCG, QUANTITATIVE, PREGNANCY: hCG, Beta Chain, Quant, S: <1 m[IU]/mL (ref <5)

## 2020-02-24 LAB — POC URINE PREG, ED: Preg Test, Ur: NEGATIVE

## 2020-02-24 NOTE — ED Triage Notes (Signed)
Pt presents with headache x 4 days. Denies nausea, vision changes, photophobia.   Pt requested pregnancy test, as she had 1 positive pregnancy test at home yesterday.

## 2020-02-24 NOTE — ED Provider Notes (Signed)
[facility]   [id number] 02/24/20 Arrival Time: 1117  ASSESSMENT & PLAN:  1. Pregnancy examination or test, negative result     UPT negative.   Pending: Labs Reviewed  HCG, QUANTITATIVE, PREGNANCY       Follow-up Information    [facility] at [facility].   Specialty: Urgent Care Why: As needed. Contact information: [address] [place] [address] [phone]              Reviewed expectations re: course of current medical issues. Questions answered. Outlined signs and symptoms indicating need for more acute intervention. Understanding verbalized. After Visit Summary given.   SUBJECTIVE: History from: patient. Bianca Webb is a 19 y.o. female who requests UPT. Positive test at home yesterday. Patient's last menstrual period was 02/13/2020 (exact date). No abd pain. No vaginal bleeding. Normal PO intake without n/v.    OBJECTIVE:  Vitals:   02/24/20 1236  BP: 113/77  Pulse: 72  Resp: 16  Temp: 98.3 F (36.8 C)  TempSrc: Oral  SpO2: 100%    General appearance: alert; no distress Lungs: speaks full sentences without difficulty; unlabored Skin: warm and dry Psychological: alert and cooperative; normal mood and affect  Labs: Results for orders placed or performed during the hospital encounter of 02/24/20  POC urine preg, ED (not at [facility])  Result Value Ref Range   Preg Test, Ur NEGATIVE NEGATIVE   Labs Reviewed  HCG, QUANTITATIVE, PREGNANCY  POC URINE PREG, ED     No Known Allergies  History reviewed. No pertinent past medical history. Social History   Socioeconomic History  . Marital status: Single    Spouse name: Not on file  . Number of children: Not on file  . Years of education: Not on file  . Highest education level: Not on file  Occupational History  . Not on file  Tobacco Use  . Smoking status: Never Smoker  . Smokeless tobacco: Never Used  Vaping Use  . Vaping Use: Never used   Substance and Sexual Activity  . Alcohol use: No  . Drug use: Not Currently  . Sexual activity: Yes    Birth control/protection: None  Other Topics Concern  . Not on file  Social History Narrative   ** Merged History Encounter **       Social Determinants of Health   Financial Resource Strain:   . Difficulty of Paying Living Expenses: Not on file  Food Insecurity:   . Worried About Programme researcher, broadcasting/film/video in the Last Year: Not on file  . Ran Out of Food in the Last Year: Not on file  Transportation Needs:   . Lack of Transportation (Medical): Not on file  . Lack of Transportation (Non-Medical): Not on file  Physical Activity:   . Days of Exercise per Week: Not on file  . Minutes of Exercise per Session: Not on file  Stress:   . Feeling of Stress : Not on file  Social Connections:   . Frequency of Communication with Friends and Family: Not on file  . Frequency of Social Gatherings with Friends and Family: Not on file  . Attends Religious Services: Not on file  . Active Member of Clubs or Organizations: Not on file  . Attends Banker Meetings: Not on file  . Marital Status: Not on file  Intimate Partner Violence:   . Fear of Current or Ex-Partner: Not on file  . Emotionally Abused: Not on file  .  Physically Abused: Not on file  . Sexually Abused: Not on file   Family History  Problem Relation Age of Onset  . Healthy Mother   . Healthy Father   . Cancer Neg Hx   . Hypertension Neg Hx   . Eclampsia Neg Hx   . Diabetes Neg Hx    Past Surgical History:  Procedure Laterality Date  . NO PAST SURGERIES       Mardella Layman, MD 02/24/20 [phone]

## 2020-06-03 ENCOUNTER — Encounter (HOSPITAL_COMMUNITY): Payer: Self-pay

## 2020-06-03 ENCOUNTER — Ambulatory Visit (HOSPITAL_COMMUNITY)
Admission: EM | Admit: 2020-06-03 | Discharge: 2020-06-03 | Disposition: A | Discharge status: Home / Self Care | Payer: Medicaid Other | Attending: Emergency Medicine | Admitting: Emergency Medicine

## 2020-06-03 ENCOUNTER — Other Ambulatory Visit: Payer: Self-pay

## 2020-06-03 DIAGNOSIS — G8929 Other chronic pain: Secondary | ICD-10-CM

## 2020-06-03 DIAGNOSIS — Z113 Encounter for screening for infections with a predominantly sexual mode of transmission: Secondary | ICD-10-CM | POA: Insufficient documentation

## 2020-06-03 DIAGNOSIS — M545 Low back pain, unspecified: Secondary | ICD-10-CM | POA: Insufficient documentation

## 2020-06-03 MED ORDER — IBUPROFEN 600 MG PO TABS: 600.0000 mg | ORAL_TABLET | Freq: Four times a day (QID) | ORAL | 0 refills | Status: DC | PRN

## 2020-06-03 MED ORDER — TIZANIDINE HCL 4 MG PO TABS: 4.0000 mg | ORAL_TABLET | Freq: Three times a day (TID) | ORAL | 0 refills | Status: DC | PRN

## 2020-06-03 NOTE — ED Triage Notes (Signed)
Pt presents with back pain X 2 days. 
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
## 2020-06-03 NOTE — Discharge Instructions (Addendum)
Take 600 mg of ibuprofen combined with 1000 mg of Tylenol 3-4 times a day as needed for pain.  Zanaflex for muscle spasms.  Below is a list of primary care practices who are taking new patients for you to follow-up with.  [facility] internal medicine clinic Ground Floor - [facility], [address] [place], [place], [place] [address] [phone]  [facility] at [facility] [address] [place], [place] [address] [phone]  [facility] [address] [place], [place] [address] [phone]  [facility] [phone] [address] [place] [address]  [facility]: [address] [place] [address]  [phone]  [facility]: [address] [place] [address]   [phone]  [facility] Family Medicine: [address] [place] [address]  [phone]  [facility] : [address] [place] [address] [phone]  [facility] Primary Care: [address] [place] [address] [phone]  [facility]: [address] [place] [address] [phone]  Dr. Oneal Grout [address] [facility] [place] [address]  [phone]  Dr. Jackie Plum, [facility]. [address] [place], [place] [address]  [phone]  Go to www.goodrx.com to look up your medications. This will give you a list of where you can find your prescriptions at the most affordable prices. Or ask the pharmacist what the cash price is, or if they have any other discount programs available to help make your medication more affordable. This can be less expensive than what you would pay with insurance.

## 2020-06-06 ENCOUNTER — Telehealth (HOSPITAL_COMMUNITY): Payer: Self-pay | Admitting: Emergency Medicine

## 2020-06-06 LAB — CERVICOVAGINAL ANCILLARY ONLY
Bacterial Vaginitis (gardnerella): POSITIVE — AB
Candida Glabrata: NEGATIVE
Candida Vaginitis: POSITIVE — AB
Chlamydia: NEGATIVE
Comment: NEGATIVE
Comment: NEGATIVE
Comment: NEGATIVE
Comment: NEGATIVE
Comment: NEGATIVE
Comment: NORMAL
Neisseria Gonorrhea: NEGATIVE
Trichomonas: NEGATIVE

## 2020-06-06 MED ORDER — METRONIDAZOLE 500 MG PO TABS: 500.0000 mg | ORAL_TABLET | Freq: Two times a day (BID) | ORAL | 0 refills | Status: DC

## 2020-06-06 MED ORDER — FLUCONAZOLE 150 MG PO TABS: 150.0000 mg | ORAL_TABLET | Freq: Once | ORAL | 0 refills | Status: AC

## 2020-06-18 ENCOUNTER — Ambulatory Visit (HOSPITAL_COMMUNITY)
Admission: EM | Admit: 2020-06-18 | Discharge: 2020-06-18 | Disposition: A | Discharge status: Home / Self Care | Payer: Medicaid Other | Attending: Physician Assistant | Admitting: Physician Assistant

## 2020-06-18 ENCOUNTER — Encounter (HOSPITAL_COMMUNITY): Payer: Self-pay

## 2020-06-18 ENCOUNTER — Other Ambulatory Visit: Payer: Self-pay

## 2020-06-18 DIAGNOSIS — Z3202 Encounter for pregnancy test, result negative: Secondary | ICD-10-CM | POA: Diagnosis not present

## 2020-06-18 LAB — POC URINE PREG, ED: Preg Test, Ur: NEGATIVE

## 2020-06-18 NOTE — Discharge Instructions (Signed)
Pregnancy test negative in clinic today Recommend retesting after a missed period Follow up with OBGYN

## 2020-06-18 NOTE — ED Triage Notes (Signed)
Pt reports taking an at home pregnancy test today that showed positive. Pt states she has been cramping.
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
## 2020-09-05 IMAGING — US US MFM OB COMP + 14 WK
1 series · 13 of 28 positions shown · non-contrast
Comparison: none

[Series 1: us mfm ob comp + 14 wk · 95 acquisitions, 13 frames shown]
[im 4/95]
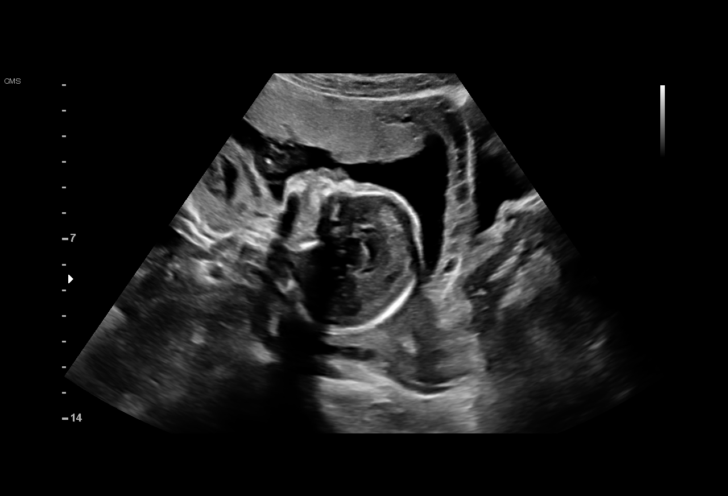
[im 11/95]
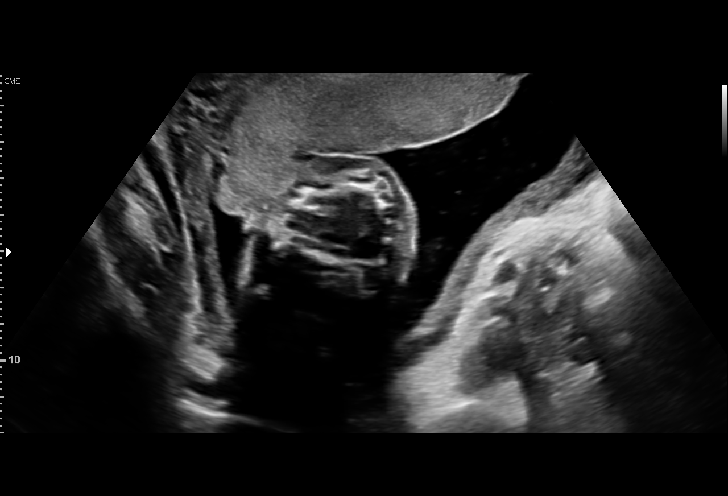
[im 18/95]
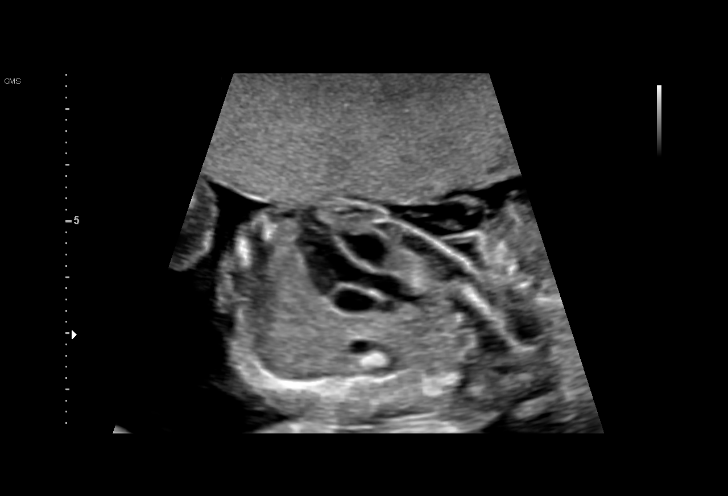
[im 25/95]
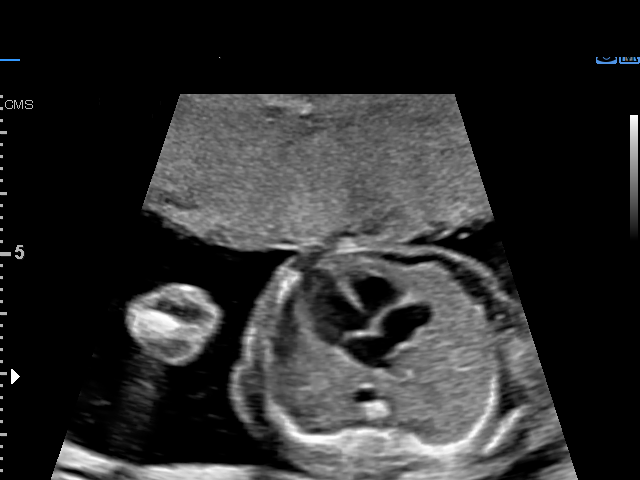
[im 32/95]
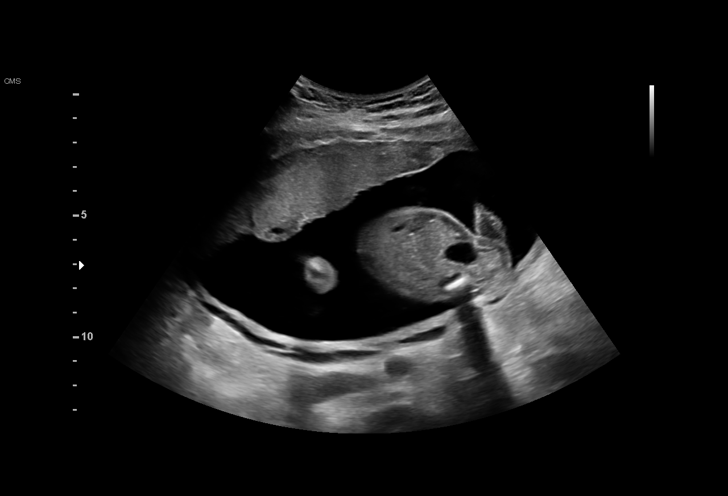
[im 39/95]
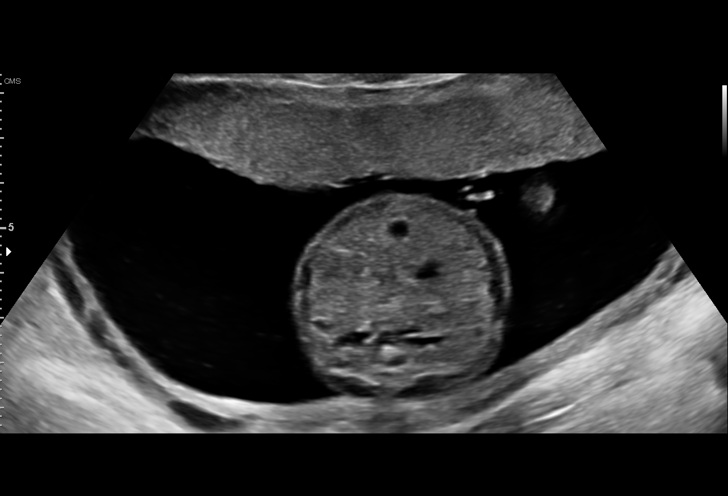
[im 49/95]
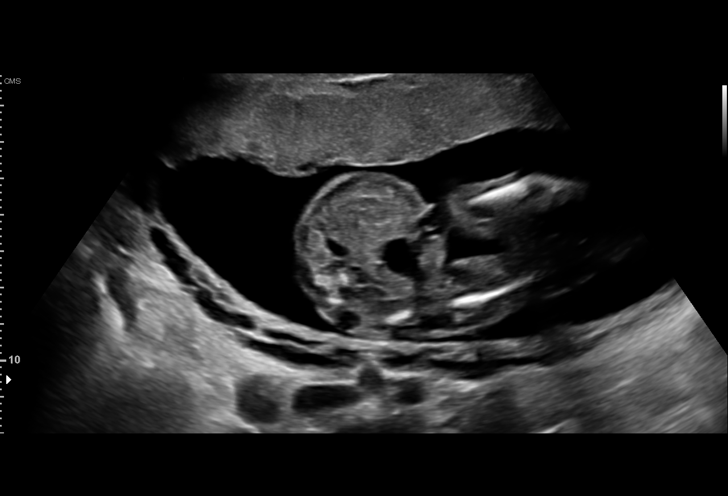
[im 56/95]
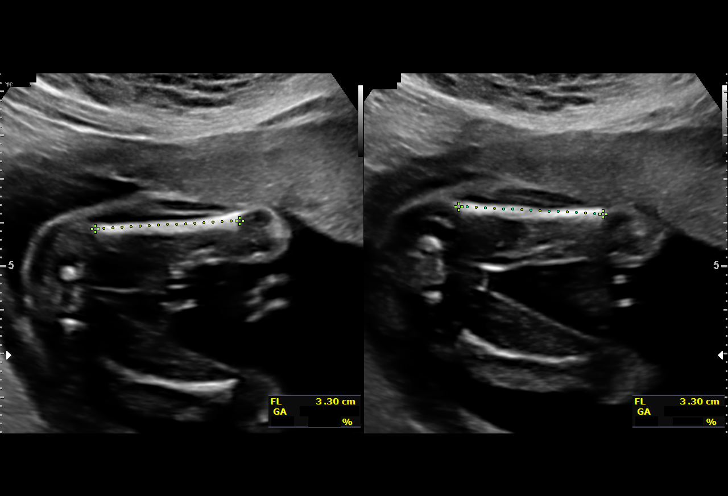
[im 63/95]
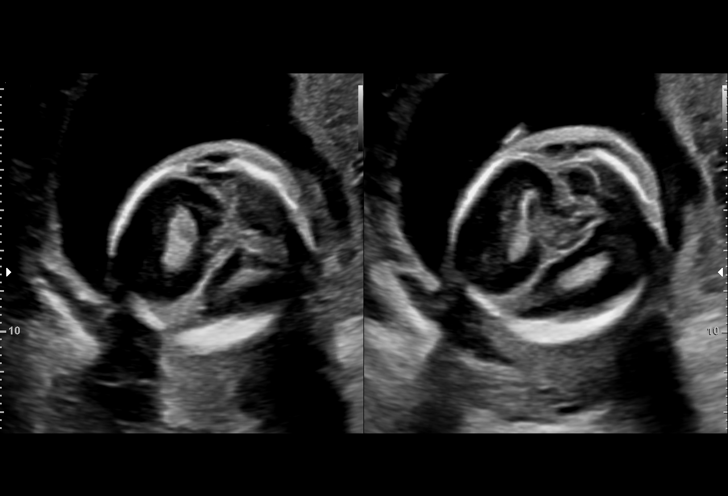
[im 70/95]
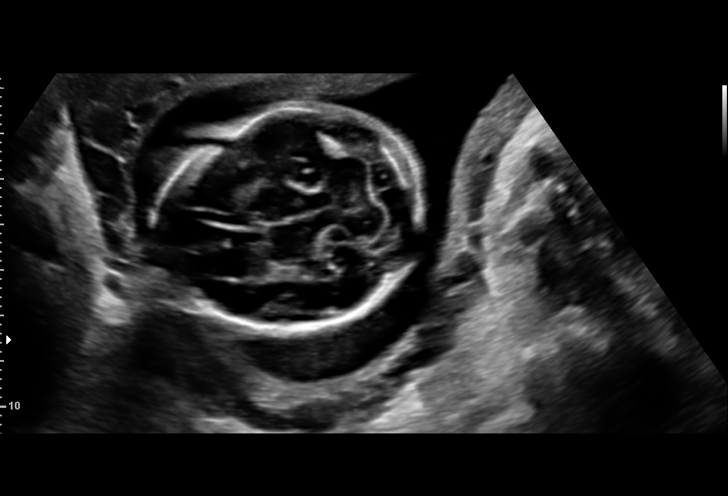
[im 77/95]
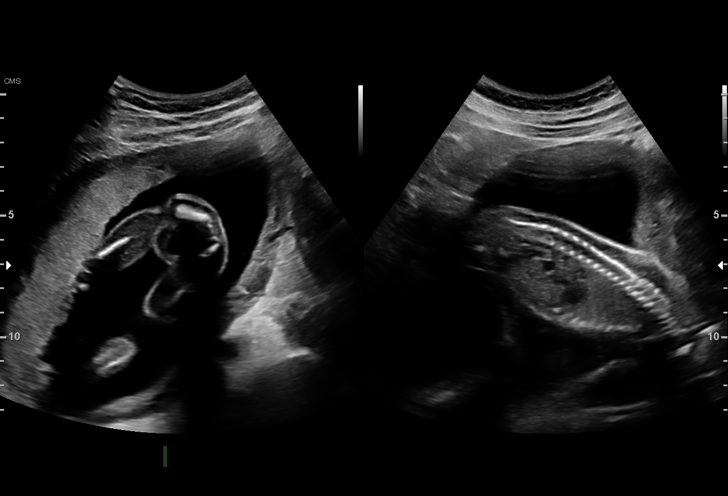
[im 84/95]
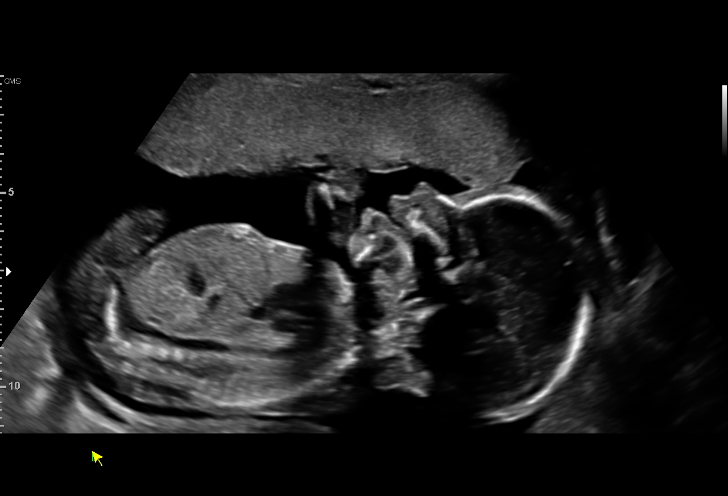
[im 91/95]
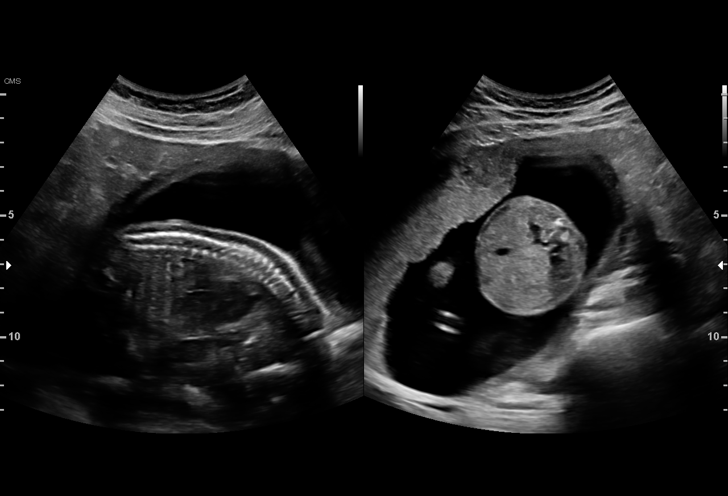

[13 of 28 positions shown; findings below may reference images not displayed]

1  US MFM OB COMP + 14 WK               76805.01     BALVINA SOL
 ----------------------------------------------------------------------

 ----------------------------------------------------------------------
Indications

  Encounter for antenatal screening for
  malformations
  19 weeks gestation of pregnancy
 ----------------------------------------------------------------------
Fetal Evaluation

 Num Of Fetuses:          1
 Fetal Heart Rate(bpm):   148
 Cardiac Activity:        Observed
 Presentation:            Cephalic
 Placenta:                Anterior
 P. Cord Insertion:       Visualized

 Amniotic Fluid
 AFI FV:      Within normal limits

                             Largest Pocket(cm)

Biometry

 BPD:      47.4  mm     G. Age:  20w 2d         80  %    CI:        75.41   %    70 - 86
                                                         FL/HC:       19.0  %    16.8 -
 HC:      173.1  mm     G. Age:  19w 6d         56  %    HC/AC:       1.12       1.09 -
 AC:      154.2  mm     G. Age:  20w 4d         77  %    FL/BPD:      69.4  %
 FL:       32.9  mm     G. Age:  20w 2d         68  %    FL/AC:       21.3  %    20 - 24
 HUM:      31.8  mm     G. Age:  20w 4d         80  %
 CER:      20.1  mm     G. Age:  19w 1d         41  %
 NFT:       3.9  mm

 CM:          6  mm
 Est. FW:     351   gm   0 lb 12 oz      59  %
OB History

 Gravidity:    1         Term:   0        Prem:   0        SAB:   0
 TOP:          0       Ectopic:  0        Living: 0
Gestational Age

 LMP:           19w 4d        Date:  03/17/18                 EDD:   12/22/18
 U/S Today:     20w 2d                                        EDD:   12/17/18
 Best:          19w 4d     Det. By:  LMP  (03/17/18)          EDD:   12/22/18
Anatomy

 Cranium:               Appears normal         LVOT:                   Appears normal
 Cavum:                 Appears normal         Aortic Arch:            Appears normal
 Ventricles:            Appears normal         Ductal Arch:            Appears normal
 Choroid Plexus:        Appears normal         Diaphragm:              Appears normal
 Cerebellum:            Appears normal         Stomach:                Appears normal, left
                                                                       sided
 Posterior Fossa:       Appears normal         Abdomen:                Appears normal
 Nuchal Fold:           Appears normal         Abdominal Wall:         Appears nml (cord
                                                                       insert, abd wall)
 Face:                  Appears normal         Cord Vessels:           Appears normal (3
                        (orbits and profile)                           vessel cord)
 Lips:                  Appears normal         Kidneys:                Appear normal
 Palate:                Appears normal         Bladder:                Appears normal
 Thoracic:              Appears normal         Spine:                  Appears normal
 Heart:                 Appears normal         Upper Extremities:      Appears normal
                        (4CH, axis, and
                        situs)
 RVOT:                  Appears normal         Lower Extremities:      Appears normal

 Other:  Heels and 5th digit visualized. Nasal bone visualized.
Cervix Uterus Adnexa

 Cervix
 Length:            3.9  cm.
 Normal appearance by transabdominal scan.

 Uterus
 No abnormality visualized.

 Left Ovary
 Not visualized.

 Right Ovary
 Within normal limits.

 Cul De Sac
 No free fluid seen.

 Adnexa
 No abnormality visualized.
Impression

 We performed fetal anatomy scan. No makers of
 aneuploidies or fetal structural defects are seen. Fetal
 biometry is consistent with her previously-established dates.
 Amniotic fluid is normal and good fetal activity is seen.

 On cell-free fetal DNA screening, the risks of fetal
 aneuploidies are not increased. MSAFP screening showed
 low risk for open-neural tube defects.
Recommendations

 Follow-up as clinically indicated.
                 Erxleben, [place]

## 2020-11-14 ENCOUNTER — Encounter (HOSPITAL_COMMUNITY): Payer: Self-pay

## 2020-11-14 ENCOUNTER — Ambulatory Visit (HOSPITAL_COMMUNITY)
Admission: EM | Admit: 2020-11-14 | Discharge: 2020-11-14 | Disposition: A | Discharge status: Home / Self Care | Payer: Medicaid Other | Attending: Family Medicine | Admitting: Family Medicine

## 2020-11-14 ENCOUNTER — Other Ambulatory Visit: Payer: Self-pay

## 2020-11-14 DIAGNOSIS — R062 Wheezing: Secondary | ICD-10-CM | POA: Diagnosis present

## 2020-11-14 DIAGNOSIS — Z20822 Contact with and (suspected) exposure to covid-19: Secondary | ICD-10-CM | POA: Diagnosis not present

## 2020-11-14 DIAGNOSIS — Z113 Encounter for screening for infections with a predominantly sexual mode of transmission: Secondary | ICD-10-CM | POA: Insufficient documentation

## 2020-11-14 DIAGNOSIS — Z793 Long term (current) use of hormonal contraceptives: Secondary | ICD-10-CM | POA: Diagnosis not present

## 2020-11-14 DIAGNOSIS — R0789 Other chest pain: Secondary | ICD-10-CM | POA: Insufficient documentation

## 2020-11-14 DIAGNOSIS — J069 Acute upper respiratory infection, unspecified: Secondary | ICD-10-CM | POA: Insufficient documentation

## 2020-11-14 LAB — SARS CORONAVIRUS 2 (TAT 6-24 HRS): SARS Coronavirus 2: NEGATIVE

## 2020-11-14 MED ORDER — PROMETHAZINE-DM 6.25-15 MG/5ML PO SYRP: 5.0000 mL | ORAL_SOLUTION | Freq: Four times a day (QID) | ORAL | 0 refills | Status: DC | PRN

## 2020-11-14 MED ORDER — ALBUTEROL SULFATE HFA 108 (90 BASE) MCG/ACT IN AERS: 1.0000 | INHALATION_SPRAY | Freq: Four times a day (QID) | RESPIRATORY_TRACT | 0 refills | Status: DC | PRN

## 2020-11-14 NOTE — ED Triage Notes (Signed)
Pt reports nasal congestion and body ache sx 2 days. Reports she has right sided rib cage pressure when smoking marihuana.
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
## 2020-11-15 LAB — CERVICOVAGINAL ANCILLARY ONLY
Bacterial Vaginitis (gardnerella): POSITIVE — AB
Candida Glabrata: NEGATIVE
Candida Vaginitis: NEGATIVE
Chlamydia: NEGATIVE
Comment: NEGATIVE
Comment: NEGATIVE
Comment: NEGATIVE
Comment: NEGATIVE
Comment: NEGATIVE
Comment: NORMAL
Neisseria Gonorrhea: NEGATIVE
Trichomonas: NEGATIVE

## 2021-03-14 ENCOUNTER — Encounter: Payer: Self-pay | Admitting: Obstetrics and Gynecology

## 2021-03-14 ENCOUNTER — Ambulatory Visit (INDEPENDENT_AMBULATORY_CARE_PROVIDER_SITE_OTHER): Payer: Medicaid Other | Admitting: *Deleted

## 2021-03-14 ENCOUNTER — Other Ambulatory Visit: Payer: Self-pay

## 2021-03-14 VITALS — BP 109/69 | HR 66

## 2021-03-14 DIAGNOSIS — N912 Amenorrhea, unspecified: Secondary | ICD-10-CM

## 2021-03-14 DIAGNOSIS — Z3201 Encounter for pregnancy test, result positive: Secondary | ICD-10-CM

## 2021-03-14 DIAGNOSIS — Z3480 Encounter for supervision of other normal pregnancy, unspecified trimester: Secondary | ICD-10-CM

## 2021-03-14 DIAGNOSIS — Z3200 Encounter for pregnancy test, result unknown: Secondary | ICD-10-CM

## 2021-03-14 LAB — POCT URINE PREGNANCY: Preg Test, Ur: POSITIVE — AB

## 2021-03-14 MED ORDER — VITAFOL GUMMIES 3.33-0.333-34.8 MG PO CHEW: 1.0000 | CHEWABLE_TABLET | Freq: Every day | ORAL | 5 refills | Status: DC

## 2021-03-14 NOTE — Progress Notes (Signed)
Bianca Webb presents today for UPT. She has no unusual complaints. LMP: 02/07/21    OBJECTIVE: Appears well, in no apparent distress.  OB History     Gravida  1   Para  1   Term  1   Preterm  0   AB  0   Living  1      SAB  0   IAB  0   Ectopic  0   Multiple  0   Live Births  1          Home UPT Result: positive In-Office UPT result: positive I have reviewed the patient's medical, obstetrical, social, and family histories, and medications.   ASSESSMENT: Positive pregnancy test  PLAN Prenatal care to be completed at: [facility]. PNV RX sent. Denies nausea at this time.

## 2021-03-20 ENCOUNTER — Inpatient Hospital Stay (HOSPITAL_COMMUNITY)
Admission: AD | Admit: 2021-03-20 | Discharge: 2021-03-20 | Disposition: A | Discharge status: Home / Self Care | Payer: Medicaid Other | Attending: Obstetrics and Gynecology | Admitting: Obstetrics and Gynecology

## 2021-03-20 ENCOUNTER — Emergency Department (HOSPITAL_COMMUNITY): Payer: Medicaid Other

## 2021-03-20 ENCOUNTER — Other Ambulatory Visit: Payer: Self-pay

## 2021-03-20 ENCOUNTER — Encounter (HOSPITAL_COMMUNITY): Payer: Self-pay | Admitting: Emergency Medicine

## 2021-03-20 DIAGNOSIS — O9A311 Physical abuse complicating pregnancy, first trimester: Secondary | ICD-10-CM | POA: Diagnosis present

## 2021-03-20 DIAGNOSIS — Z3A01 Less than 8 weeks gestation of pregnancy: Secondary | ICD-10-CM | POA: Insufficient documentation

## 2021-03-20 DIAGNOSIS — R0989 Other specified symptoms and signs involving the circulatory and respiratory systems: Secondary | ICD-10-CM | POA: Diagnosis not present

## 2021-03-20 DIAGNOSIS — R109 Unspecified abdominal pain: Secondary | ICD-10-CM | POA: Insufficient documentation

## 2021-03-20 HISTORY — DX: Encounter for supervision of normal pregnancy, unspecified, unspecified trimester: Z34.90

## 2021-03-20 LAB — CBC WITH DIFFERENTIAL/PLATELET
Abs Immature Granulocytes: 0.01 10*3/uL (ref 0.00–0.07)
Basophils Absolute: 0 10*3/uL (ref 0.0–0.1)
Basophils Relative: 0 %
Eosinophils Absolute: 0 10*3/uL (ref 0.0–0.5)
Eosinophils Relative: 1 %
HCT: 37.1 % (ref 36.0–46.0)
Hemoglobin: 12.4 g/dL (ref 12.0–15.0)
Immature Granulocytes: 0 %
Lymphocytes Relative: 32 %
Lymphs Abs: 1.4 10*3/uL (ref 0.7–4.0)
MCH: 30.5 pg (ref 26.0–34.0)
MCHC: 33.4 g/dL (ref 30.0–36.0)
MCV: 91.4 fL (ref 80.0–100.0)
Monocytes Absolute: 0.4 10*3/uL (ref 0.1–1.0)
Monocytes Relative: 9 %
Neutro Abs: 2.5 10*3/uL (ref 1.7–7.7)
Neutrophils Relative %: 58 %
Platelets: 146 10*3/uL — ABNORMAL LOW (ref 150–400)
RBC: 4.06 MIL/uL (ref 3.87–5.11)
RDW: 13.4 % (ref 11.5–15.5)
WBC: 4.3 10*3/uL (ref 4.0–10.5)
nRBC: 0 % (ref 0.0–0.2)

## 2021-03-20 LAB — URINALYSIS, ROUTINE W REFLEX MICROSCOPIC
Bilirubin Urine: NEGATIVE
Glucose, UA: NEGATIVE mg/dL
Hgb urine dipstick: NEGATIVE
Ketones, ur: 40 mg/dL — AB
Leukocytes,Ua: NEGATIVE
Nitrite: NEGATIVE
Protein, ur: NEGATIVE mg/dL
Specific Gravity, Urine: >1.03 — ABNORMAL HIGH (ref 1.005–1.030)
pH: 6 (ref 5.0–8.0)

## 2021-03-20 LAB — COMPREHENSIVE METABOLIC PANEL
ALT: 13 U/L (ref 0–44)
AST: 17 U/L (ref 15–41)
Albumin: 3.9 g/dL (ref 3.5–5.0)
Alkaline Phosphatase: 35 U/L — ABNORMAL LOW (ref 38–126)
Anion gap: 9 (ref 5–15)
BUN: 15 mg/dL (ref 6–20)
CO2: 22 mmol/L (ref 22–32)
Calcium: 8.8 mg/dL — ABNORMAL LOW (ref 8.9–10.3)
Chloride: 107 mmol/L (ref 98–111)
Creatinine, Ser: 0.63 mg/dL (ref 0.44–1.00)
GFR, Estimated: >60 mL/min (ref >60)
Glucose, Bld: 90 mg/dL (ref 70–99)
Potassium: 2.8 mmol/L — ABNORMAL LOW (ref 3.5–5.1)
Sodium: 138 mmol/L (ref 135–145)
Total Bilirubin: 0.5 mg/dL (ref 0.3–1.2)
Total Protein: 6.4 g/dL — ABNORMAL LOW (ref 6.5–8.1)

## 2021-03-20 LAB — HCG, QUANTITATIVE, PREGNANCY: hCG, Beta Chain, Quant, S: 11311 m[IU]/mL — ABNORMAL HIGH (ref <5)

## 2021-03-20 LAB — LIPASE, BLOOD: Lipase: 21 U/L (ref 11–51)

## 2021-03-20 LAB — I-STAT BETA HCG BLOOD, ED (MC, WL, AP ONLY): I-stat hCG, quantitative: >2000 m[IU]/mL — ABNORMAL HIGH (ref <5)

## 2021-03-20 MED ORDER — IOHEXOL 350 MG/ML SOLN
75.0000 mL | Freq: Once | INTRAVENOUS | Status: AC | PRN
  Administered 2021-03-20: 75 mL via INTRAVENOUS
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
## 2021-04-03 ENCOUNTER — Other Ambulatory Visit: Payer: Self-pay

## 2021-04-03 ENCOUNTER — Inpatient Hospital Stay (HOSPITAL_COMMUNITY)
Admission: AD | Admit: 2021-04-03 | Discharge: 2021-04-03 | Disposition: A | Discharge status: Home / Self Care | Payer: Medicaid Other | Attending: Family Medicine | Admitting: Family Medicine

## 2021-04-03 ENCOUNTER — Encounter (HOSPITAL_COMMUNITY): Payer: Self-pay | Admitting: Family Medicine

## 2021-04-03 DIAGNOSIS — Z3A01 Less than 8 weeks gestation of pregnancy: Secondary | ICD-10-CM | POA: Diagnosis not present

## 2021-04-03 DIAGNOSIS — O26811 Pregnancy related exhaustion and fatigue, first trimester: Secondary | ICD-10-CM

## 2021-04-03 DIAGNOSIS — O26891 Other specified pregnancy related conditions, first trimester: Secondary | ICD-10-CM | POA: Insufficient documentation

## 2021-04-03 DIAGNOSIS — R519 Headache, unspecified: Secondary | ICD-10-CM | POA: Diagnosis not present

## 2021-04-03 DIAGNOSIS — R42 Dizziness and giddiness: Secondary | ICD-10-CM | POA: Insufficient documentation

## 2021-04-03 DIAGNOSIS — R531 Weakness: Secondary | ICD-10-CM | POA: Insufficient documentation

## 2021-04-03 LAB — CBC
HCT: 39.8 % (ref 36.0–46.0)
Hemoglobin: 13.6 g/dL (ref 12.0–15.0)
MCH: 30.8 pg (ref 26.0–34.0)
MCHC: 34.2 g/dL (ref 30.0–36.0)
MCV: 90.2 fL (ref 80.0–100.0)
Platelets: 210 10*3/uL (ref 150–400)
RBC: 4.41 MIL/uL (ref 3.87–5.11)
RDW: 13 % (ref 11.5–15.5)
WBC: 8.2 10*3/uL (ref 4.0–10.5)
nRBC: 0 % (ref 0.0–0.2)

## 2021-04-03 LAB — URINALYSIS, ROUTINE W REFLEX MICROSCOPIC
Bilirubin Urine: NEGATIVE
Glucose, UA: NEGATIVE mg/dL
Hgb urine dipstick: NEGATIVE
Ketones, ur: NEGATIVE mg/dL
Leukocytes,Ua: NEGATIVE
Nitrite: NEGATIVE
Protein, ur: NEGATIVE mg/dL
Specific Gravity, Urine: 1.025 (ref 1.005–1.030)
pH: 6.5 (ref 5.0–8.0)

## 2021-04-03 LAB — COMPREHENSIVE METABOLIC PANEL
ALT: 14 U/L (ref 0–44)
AST: 15 U/L (ref 15–41)
Albumin: 3.7 g/dL (ref 3.5–5.0)
Alkaline Phosphatase: 40 U/L (ref 38–126)
Anion gap: 9 (ref 5–15)
BUN: 13 mg/dL (ref 6–20)
CO2: 23 mmol/L (ref 22–32)
Calcium: 9 mg/dL (ref 8.9–10.3)
Chloride: 105 mmol/L (ref 98–111)
Creatinine, Ser: 0.67 mg/dL (ref 0.44–1.00)
GFR, Estimated: >60 mL/min (ref >60)
Glucose, Bld: 97 mg/dL (ref 70–99)
Potassium: 3.9 mmol/L (ref 3.5–5.1)
Sodium: 137 mmol/L (ref 135–145)
Total Bilirubin: 0.4 mg/dL (ref 0.3–1.2)
Total Protein: 6.8 g/dL (ref 6.5–8.1)

## 2021-04-03 LAB — RAPID URINE DRUG SCREEN, HOSP PERFORMED
Amphetamines: NOT DETECTED
Barbiturates: NOT DETECTED
Benzodiazepines: NOT DETECTED
Cocaine: NOT DETECTED
Opiates: NOT DETECTED
Tetrahydrocannabinol: POSITIVE — AB

## 2021-04-03 MED ORDER — POTASSIUM CHLORIDE 10 MEQ/100ML IV SOLN
10.0000 meq | INTRAVENOUS | Status: DC
  Administered 2021-04-03: 11:00:00 10 meq via INTRAVENOUS
  Filled 2021-04-03 (×4): qty 100

## 2021-04-03 MED ORDER — ACETAMINOPHEN 500 MG PO TABS
1000.0000 mg | ORAL_TABLET | Freq: Once | ORAL | Status: AC
  Administered 2021-04-03: 11:00:00 1000 mg via ORAL
  Filled 2021-04-03: qty 2

## 2021-04-03 NOTE — MAU Note (Signed)
Recevied new order from [facility], CNM to start with LR 125 ml/hr to see how patient responds then restart Potassium if patient is agrees.

## 2021-04-03 NOTE — MAU Provider Note (Signed)
History     CSN: [id number]  Arrival date and time: 04/03/21 3557  Event Date/Time  First Provider Initiated Contact with Patient 04/03/21 1027      Chief Complaint  Patient presents with   Dizziness   Fatigue   Headache   HPI Bianca Webb is a 20 y.o. G2P1001 at [redacted]w[redacted]d who presents to MAU with chief complaints of dizziness, fatigue and headache. These are new problems, onset about one week ago. Patent voices concern for dehydration. She identifies this as a likely trigger for her other complaints. She reports one episode of vomiting thus far in current pregnancy. She denies difficulty tolerating food. Diet this morning includes ham macaroni and cheese and a breakfast biscuit. She denies difficulty tolerating PO hydration. She denies lower abdominal pain, vaginal bleeding, dysuria, fever or recent illness.  Patient's headache is anterior. Pain score is 5/10. Pain does not radiate. She denies aggravating or alleviating factors. She has not taken medication or tried other treatments for this complaint.  Patient has scheduled prenatal care with [facility].  OB History     Gravida  2   Para  1   Term  1   Preterm  0   AB  0   Living  1      SAB  0   IAB  0   Ectopic  0   Multiple  0   Live Births  1           Past Medical History:  Diagnosis Date   Pregnant     Past Surgical History:  Procedure Laterality Date   NO PAST SURGERIES      Family History  Problem Relation Age of Onset   Healthy Mother    Healthy Father    Cancer Neg Hx    Hypertension Neg Hx    Eclampsia Neg Hx    Diabetes Neg Hx     Social History   Tobacco Use   Smoking status: Never   Smokeless tobacco: Never  Vaping Use   Vaping Use: Never used  Substance Use Topics   Alcohol use: No   Drug use: Yes    Frequency: 4.0 times per week    Types: Marijuana    Allergies: No Known Allergies  Medications Prior to Admission  Medication Sig Dispense Refill Last Dose    Prenatal Vit-Fe Phos-FA-Omega (VITAFOL GUMMIES) 3.33-0.333-34.8 MG CHEW Chew 1 tablet by mouth daily. 90 tablet 5 04/02/2021   albuterol (VENTOLIN HFA) 108 (90 Base) MCG/ACT inhaler Inhale 1-2 puffs into the lungs every 6 (six) hours as needed for wheezing or shortness of breath. 18 g 0 Unknown   cetirizine (ZYRTEC ALLERGY) 10 MG tablet Take 1 tablet (10 mg total) by mouth daily. 30 tablet 2    ondansetron (ZOFRAN ODT) 4 MG disintegrating tablet Take 1 tablet (4 mg total) by mouth every 8 (eight) hours as needed for nausea or vomiting. 20 tablet 0 Unknown    Review of Systems  Constitutional:  Positive for fatigue.  Neurological:  Positive for headaches.  All other systems reviewed and are negative. Physical Exam   Blood pressure 110/68, pulse 77, temperature 98.1 F (36.7 C), temperature source Oral, resp. rate 17, height 5\' 2"  (1.575 m), weight 62.8 kg, last menstrual period 02/07/2021, SpO2 100 %.  Physical Exam Vitals and nursing note reviewed.  Constitutional:      Appearance: She is well-developed. She is not ill-appearing.  Cardiovascular:     Rate and Rhythm:  Normal rate and regular rhythm.     Heart sounds: Normal heart sounds.  Pulmonary:     Effort: Pulmonary effort is normal.     Breath sounds: Normal breath sounds.  Abdominal:     General: Bowel sounds are normal.     Palpations: Abdomen is soft.     Tenderness: There is no abdominal tenderness.  Skin:    General: Skin is warm and dry.     Capillary Refill: Capillary refill takes less than 2 seconds.  Neurological:     Mental Status: She is alert and oriented to person, place, and time.  Psychiatric:        Mood and Affect: Mood normal.        Speech: Speech normal.        Behavior: Behavior normal.    MAU Course  Procedures  --EMR reviewed. Critically low Potassium of 2.8 on 03/20/2021. No documented repletion. Patient states she was unaware and not treated. Discussed potential for ongoing Hypokalemia. Pt  agreeable to plan for repletion with IV Potassium. Orders placed accordingly  --Patient unable to tolerate IV infusion. Infusion stopped s/p RN discussion with Dr. Adrian Blackwater. CMET resulted prior to restart of Potassium, which is a normal reading on today's draw. Order for Potassium discontinued. IV not restarted.   --THC +. Patient reports use by close associates, denies personal use. Discussed THC could be potential culprit of all complaints. Encouraged abstinence especially in pregnancy.  Patient Vitals for the past 24 hrs:  BP Temp Temp src Pulse Resp SpO2 Height Weight  04/03/21 1207 (!) 94/46 98.8 F (37.1 C) Oral 72 19 100 % -- --  04/03/21 1018 110/68 98.1 F (36.7 C) Oral 77 17 100 % -- --  04/03/21 1005 106/62 98.8 F (37.1 C) Oral 76 18 100 % -- --  04/03/21 1000 -- -- -- -- -- -- 5\' 2"  (1.575 m) 62.8 kg   Results for orders placed or performed during the hospital encounter of 04/03/21 (from the past 24 hour(s))  CBC     Status: None   Collection Time: 04/03/21 10:20 AM  Result Value Ref Range   WBC 8.2 4.0 - 10.5 K/uL   RBC 4.41 3.87 - 5.11 MIL/uL   Hemoglobin 13.6 12.0 - 15.0 g/dL   HCT 04/05/21 47.0 - 92.9 %   MCV 90.2 80.0 - 100.0 fL   MCH 30.8 26.0 - 34.0 pg   MCHC 34.2 30.0 - 36.0 g/dL   RDW 57.4 73.4 - 03.7 %   Platelets 210 150 - 400 K/uL   nRBC 0.0 0.0 - 0.2 %  Comprehensive metabolic panel     Status: None   Collection Time: 04/03/21 10:20 AM  Result Value Ref Range   Sodium 137 135 - 145 mmol/L   Potassium 3.9 3.5 - 5.1 mmol/L   Chloride 105 98 - 111 mmol/L   CO2 23 22 - 32 mmol/L   Glucose, Bld 97 70 - 99 mg/dL   BUN 13 6 - 20 mg/dL   Creatinine, Ser 04/05/21 0.44 - 1.00 mg/dL   Calcium 9.0 8.9 - 4.38 mg/dL   Total Protein 6.8 6.5 - 8.1 g/dL   Albumin 3.7 3.5 - 5.0 g/dL   AST 15 15 - 41 U/L   ALT 14 0 - 44 U/L   Alkaline Phosphatase 40 38 - 126 U/L   Total Bilirubin 0.4 0.3 - 1.2 mg/dL   GFR, Estimated 38.1 >84 mL/min   Anion gap 9 5 -  15  Urinalysis,  Routine w reflex microscopic Urine, Clean Catch     Status: None   Collection Time: 04/03/21 10:21 AM  Result Value Ref Range   Color, Urine YELLOW YELLOW   APPearance CLEAR CLEAR   Specific Gravity, Urine 1.025 1.005 - 1.030   pH 6.5 5.0 - 8.0   Glucose, UA NEGATIVE NEGATIVE mg/dL   Hgb urine dipstick NEGATIVE NEGATIVE   Bilirubin Urine NEGATIVE NEGATIVE   Ketones, ur NEGATIVE NEGATIVE mg/dL   Protein, ur NEGATIVE NEGATIVE mg/dL   Nitrite NEGATIVE NEGATIVE   Leukocytes,Ua NEGATIVE NEGATIVE  Rapid urine drug screen (hospital performed)     Status: Abnormal   Collection Time: 04/03/21 10:21 AM  Result Value Ref Range   Opiates NONE DETECTED NONE DETECTED   Cocaine NONE DETECTED NONE DETECTED   Benzodiazepines NONE DETECTED NONE DETECTED   Amphetamines NONE DETECTED NONE DETECTED   Tetrahydrocannabinol POSITIVE (A) NONE DETECTED   Barbiturates NONE DETECTED NONE DETECTED   Assessment and Plan  --20 y.o. G2P1001 at [redacted]w[redacted]d  --Generalized weakness --No abnormal findings on labs or physical today --Encouraged more frequent snacking with clean protein --Headache resolved with Tylenol, continue PRN --THC + --Discharge home in stable condition  Calvert Cantor, CNM 04/03/2021, 1:39 PM

## 2021-04-03 NOTE — MAU Note (Signed)
RN begins to administer Potassium IV when RN observes patient crying and grabbing her left arm.  Pt states that her arm feels tight and that it is hard to move at the shoulder.  Potassium IV stopped.  Provider notified for evaluation.

## 2021-04-03 NOTE — MAU Note (Signed)
Presents c/o fatigue, dizziness, and headache.  Reports has had intermittent H/A x1 week, hasn't taken any meds to treat.  States H/A causes her eyes to hurt. Also reports dehydration, hasn't had N/V the entire pregnancy, states "I haven't thrown up the entire time I've been pregnant.  Denies VB.

## 2021-04-03 NOTE — Discharge Instructions (Signed)

## 2021-05-01 ENCOUNTER — Encounter: Payer: Medicaid Other | Admitting: Advanced Practice Midwife

## 2021-06-26 ENCOUNTER — Ambulatory Visit (INDEPENDENT_AMBULATORY_CARE_PROVIDER_SITE_OTHER): Payer: Medicaid Other

## 2021-06-26 ENCOUNTER — Encounter (HOSPITAL_COMMUNITY): Payer: Self-pay

## 2021-06-26 ENCOUNTER — Ambulatory Visit (HOSPITAL_COMMUNITY)
Admission: EM | Admit: 2021-06-26 | Discharge: 2021-06-26 | Disposition: A | Discharge status: Home / Self Care | Payer: Medicaid Other | Attending: Physician Assistant | Admitting: Physician Assistant

## 2021-06-26 DIAGNOSIS — Z113 Encounter for screening for infections with a predominantly sexual mode of transmission: Secondary | ICD-10-CM | POA: Diagnosis not present

## 2021-06-26 DIAGNOSIS — R0782 Intercostal pain: Secondary | ICD-10-CM | POA: Diagnosis not present

## 2021-06-26 DIAGNOSIS — N898 Other specified noninflammatory disorders of vagina: Secondary | ICD-10-CM | POA: Diagnosis present

## 2021-06-26 DIAGNOSIS — R0789 Other chest pain: Secondary | ICD-10-CM | POA: Insufficient documentation

## 2021-06-26 LAB — POC URINE PREG, ED: Preg Test, Ur: NEGATIVE

## 2021-06-26 MED ORDER — NAPROXEN 375 MG PO TABS: 375.0000 mg | ORAL_TABLET | Freq: Two times a day (BID) | ORAL | 0 refills | Status: DC

## 2021-06-26 NOTE — Discharge Instructions (Addendum)
Your chest x-ray was normal.  I am not sure what is causing your symptoms.  I have called in Naprosyn which should help with the pain.  Do not take additional NSAIDs including aspirin, ibuprofen/Advil, naproxen/Aleve with this medication as it can cause stomach bleeding.  I do recommend that you limit smoking activities with a goal of completely quitting in the future.  We will try to establish you with a primary care for further evaluation; someone should call you to schedule appointment.  If anything worsens please return for reevaluation. ? ?We will contact you with your STI swab results if we need to range any treatment.  I do recommend you follow-up with an OB/GYN.  Please call to schedule an appointment.  Wear loosefitting cotton underwear and use epileptogenic soaps and detergents.  If you develop any worsening symptoms you need to return immediately. ?
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
## 2021-06-26 NOTE — ED Triage Notes (Signed)
Pt presents with complaints of brown vaginal discharge that began after having an abortion in Jan. Pt states she also has a lump that appears under her rt breast when she smokes. Pt states the lump is painful to touch.  ?

## 2021-06-27 ENCOUNTER — Telehealth (HOSPITAL_COMMUNITY): Payer: Self-pay | Admitting: *Deleted

## 2021-06-27 LAB — CERVICOVAGINAL ANCILLARY ONLY
Bacterial Vaginitis (gardnerella): POSITIVE — AB
Candida Glabrata: NEGATIVE
Candida Vaginitis: NEGATIVE
Chlamydia: NEGATIVE
Comment: NEGATIVE
Comment: NEGATIVE
Comment: NEGATIVE
Comment: NEGATIVE
Comment: NEGATIVE
Comment: NORMAL
Neisseria Gonorrhea: NEGATIVE
Trichomonas: NEGATIVE

## 2021-06-27 MED ORDER — METRONIDAZOLE 500 MG PO TABS: 500.0000 mg | ORAL_TABLET | Freq: Two times a day (BID) | ORAL | 0 refills | Status: DC

## 2021-06-30 ENCOUNTER — Encounter (HOSPITAL_COMMUNITY): Payer: Self-pay

## 2021-08-11 ENCOUNTER — Ambulatory Visit: Payer: Medicaid Other | Admitting: Internal Medicine

## 2021-09-05 ENCOUNTER — Encounter: Payer: Self-pay | Admitting: Family Medicine

## 2021-09-05 ENCOUNTER — Ambulatory Visit (INDEPENDENT_AMBULATORY_CARE_PROVIDER_SITE_OTHER): Payer: Medicaid Other | Admitting: Family Medicine

## 2021-09-05 VITALS — BP 92/60 | HR 64 | Temp 97.6°F | Ht 62.0 in

## 2021-09-05 DIAGNOSIS — R1011 Right upper quadrant pain: Secondary | ICD-10-CM | POA: Insufficient documentation

## 2021-09-05 DIAGNOSIS — J302 Other seasonal allergic rhinitis: Secondary | ICD-10-CM | POA: Diagnosis not present

## 2021-09-05 NOTE — Patient Instructions (Signed)
Keep track of how often you are having the pain in your right upper abdomen and look for any triggers that might be causing it.  You can use the naproxen as needed for pain but be aware that you should not use this medication if you are pregnant.  You can use Tylenol for pain control if you do not know if you are pregnant or not.  Follow-up as needed.

## 2021-09-05 NOTE — Progress Notes (Signed)
New Patient Office Visit  Subjective    Patient ID: Bianca Webb, female    DOB: 05-Dec-2000  Age: 21 y.o. MRN: [id number]  CC:  Chief Complaint  Patient presents with   Establish Care    RUQ sharp pain that comes and goes. Recently went to urgent care and was given naproxen.     HPI Bianca Webb presents to establish care  Complains of 1 year history of intermittent RUQ pain. Pain is worse with movement and when smoking. Last episode of pain was 2 weeks ago. Reports having 5 mins of pain every 2-3 weeks. No post prandial pain. Denies changes in bowel habits.   Denies fever, chills, dizziness, chest pain, palpitations, shortness of breath,  N/V/D, urinary symptoms, LE edema.   States she is working on stopping smoking.   Recent UC visit for RUQ pain. XR of ribs was negative.    States she has an OB/GYN  Currently on her menstrual cycle.  Does not want birth control.   Reports taking Zyrtec for seasonal allergies.    Manager at [employer]    Outpatient Encounter Medications as of 09/05/2021  Medication Sig   cetirizine (ZYRTEC ALLERGY) 10 MG tablet Take 1 tablet (10 mg total) by mouth daily.   metroNIDAZOLE (FLAGYL) 500 MG tablet Take 1 tablet (500 mg total) by mouth 2 (two) times daily.   naproxen (NAPROSYN) 375 MG tablet Take 1 tablet (375 mg total) by mouth 2 (two) times daily.   [DISCONTINUED] albuterol (VENTOLIN HFA) 108 (90 Base) MCG/ACT inhaler Inhale 1-2 puffs into the lungs every 6 (six) hours as needed for wheezing or shortness of breath.   [DISCONTINUED] ferrous sulfate 325 (65 FE) MG tablet Take 1 tablet (325 mg total) by mouth daily with breakfast. (Patient not taking: Reported on 01/29/2019)   [DISCONTINUED] Prenatal Vit-Fe Phos-FA-Omega (VITAFOL GUMMIES) 3.33-0.333-34.8 MG CHEW Chew 1 tablet by mouth daily. (Patient not taking: Reported on 06/26/2021)   No facility-administered encounter medications on file as of 09/05/2021.    Past Medical  History:  Diagnosis Date   Pregnant     Past Surgical History:  Procedure Laterality Date   NO PAST SURGERIES      Family History  Problem Relation Age of Onset   Healthy Mother    Healthy Father    Cancer Neg Hx    Hypertension Neg Hx    Eclampsia Neg Hx    Diabetes Neg Hx     Social History   Socioeconomic History   Marital status: Single    Spouse name: Not on file   Number of children: Not on file   Years of education: Not on file   Highest education level: Not on file  Occupational History   Not on file  Tobacco Use   Smoking status: Never   Smokeless tobacco: Never  Vaping Use   Vaping Use: Never used  Substance and Sexual Activity   Alcohol use: No   Drug use: Yes    Frequency: 4.0 times per week    Types: Marijuana   Sexual activity: Yes    Birth control/protection: None  Other Topics Concern   Not on file  Social History Narrative   ** Merged History Encounter **       Social Determinants of Health   Financial Resource Strain: Not on file  Food Insecurity: Not on file  Transportation Needs: Not on file  Physical Activity: Not on file  Stress: Not on file  Social  Connections: Not on file  Intimate Partner Violence: Not on file    ROS Pertinent positives and negatives in the history of present illness.      Objective    BP 92/60 (BP Location: Left Arm, Patient Position: Sitting, Cuff Size: Large)   Pulse 64   Temp 97.6 F (36.4 C) (Temporal)   Ht 5\' 2"  (1.575 m)   LMP 09/04/2021 (Exact Date)   SpO2 97%   BMI 24.69 kg/m   Physical Exam Constitutional:      General: She is not in acute distress.    Appearance: She is not ill-appearing.  Cardiovascular:     Rate and Rhythm: Normal rate and regular rhythm.     Pulses: Normal pulses.  Pulmonary:     Effort: Pulmonary effort is normal.     Breath sounds: Normal breath sounds.  Abdominal:     General: Bowel sounds are normal. There is no distension.     Palpations: Abdomen is  soft. There is no mass.     Tenderness: There is no abdominal tenderness. There is no right CVA tenderness, left CVA tenderness, guarding or rebound.  Musculoskeletal:        General: Normal range of motion.     Cervical back: Normal range of motion and neck supple.  Skin:    General: Skin is warm and dry.  Neurological:     General: No focal deficit present.     Mental Status: She is alert and oriented to person, place, and time.  Psychiatric:        Mood and Affect: Mood normal.        Thought Content: Thought content normal.        Assessment & Plan:   Problem List Items Addressed This Visit   None Visit Diagnoses     Intermittent right upper quadrant abdominal pain    -  Primary   Seasonal allergies          She is a pleasant 21 year old female who is new to the practice and here to establish care.  Infrequent right upper quadrant abdominal pain over the past year.  Recent urgent care visit.  I reviewed the notes and x-ray results which were negative.  Discussed keeping a diary of her symptoms and looking for any triggers or associated symptoms.  Her exam is benign.  Discussed that most likely her symptoms are musculoskeletal. Continue Zyrtec as needed for seasonal allergies. Declines assistance with birth control. Follow-up as needed  Return if symptoms worsen or fail to improve.   Harland Dingwall, NP-C

## 2021-12-15 ENCOUNTER — Encounter (HOSPITAL_COMMUNITY): Payer: Self-pay | Admitting: Emergency Medicine

## 2021-12-15 ENCOUNTER — Ambulatory Visit (HOSPITAL_COMMUNITY)
Admission: EM | Admit: 2021-12-15 | Discharge: 2021-12-15 | Disposition: A | Discharge status: Home / Self Care | Payer: Medicaid Other | Attending: Family Medicine | Admitting: Family Medicine

## 2021-12-15 DIAGNOSIS — Z113 Encounter for screening for infections with a predominantly sexual mode of transmission: Secondary | ICD-10-CM | POA: Insufficient documentation

## 2021-12-15 DIAGNOSIS — R0789 Other chest pain: Secondary | ICD-10-CM | POA: Insufficient documentation

## 2021-12-15 NOTE — Discharge Instructions (Addendum)
You were seen today for several issues.  Your STD swab will be resulted tomorrow and you will be called if there is anything that needs to be treated.   For your chest wall pain I think this may be a rib that is intermittently popping out of place.  I recommend you call your primary care provider to discuss a possible referral to physical therapy for further care and treatment.

## 2021-12-15 NOTE — ED Triage Notes (Signed)
Pt c/o bump on right breast that has been intermittent for "a while". For 2 weeks been painful.  Pt also requesting to be tested for STDs. Deneis s/s or exposure.
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
## 2021-12-18 LAB — CERVICOVAGINAL ANCILLARY ONLY
Bacterial Vaginitis (gardnerella): POSITIVE — AB
Candida Glabrata: NEGATIVE
Candida Vaginitis: NEGATIVE
Chlamydia: NEGATIVE
Comment: NEGATIVE
Comment: NEGATIVE
Comment: NEGATIVE
Comment: NEGATIVE
Comment: NEGATIVE
Comment: NORMAL
Neisseria Gonorrhea: NEGATIVE
Trichomonas: NEGATIVE

## 2021-12-19 ENCOUNTER — Telehealth (HOSPITAL_COMMUNITY): Payer: Self-pay | Admitting: Emergency Medicine

## 2021-12-19 MED ORDER — METRONIDAZOLE 0.75 % VA GEL: 1.0000 | Freq: Every day | VAGINAL | 0 refills | Status: AC

## 2022-04-02 ENCOUNTER — Other Ambulatory Visit: Payer: Self-pay

## 2022-04-02 ENCOUNTER — Emergency Department (HOSPITAL_COMMUNITY)
Admission: EM | Admit: 2022-04-02 | Discharge: 2022-04-03 | Discharge status: Against Medical Advice | Payer: Medicaid Other | Attending: Emergency Medicine | Admitting: Emergency Medicine

## 2022-04-02 DIAGNOSIS — R519 Headache, unspecified: Secondary | ICD-10-CM | POA: Insufficient documentation

## 2022-04-02 DIAGNOSIS — Z5321 Procedure and treatment not carried out due to patient leaving prior to being seen by health care provider: Secondary | ICD-10-CM | POA: Diagnosis not present

## 2022-04-02 DIAGNOSIS — Z1152 Encounter for screening for COVID-19: Secondary | ICD-10-CM | POA: Diagnosis not present

## 2022-04-02 DIAGNOSIS — G43909 Migraine, unspecified, not intractable, without status migrainosus: Secondary | ICD-10-CM | POA: Diagnosis not present

## 2022-04-02 DIAGNOSIS — R059 Cough, unspecified: Secondary | ICD-10-CM | POA: Diagnosis present

## 2022-04-02 LAB — BASIC METABOLIC PANEL
Anion gap: 10 (ref 5–15)
BUN: 13 mg/dL (ref 6–20)
CO2: 27 mmol/L (ref 22–32)
Calcium: 8.9 mg/dL (ref 8.9–10.3)
Chloride: 103 mmol/L (ref 98–111)
Creatinine, Ser: 0.71 mg/dL (ref 0.44–1.00)
GFR, Estimated: >60 mL/min (ref >60)
Glucose, Bld: 97 mg/dL (ref 70–99)
Potassium: 3.3 mmol/L — ABNORMAL LOW (ref 3.5–5.1)
Sodium: 140 mmol/L (ref 135–145)

## 2022-04-02 LAB — CBC WITH DIFFERENTIAL/PLATELET
Abs Immature Granulocytes: 0 10*3/uL (ref 0.00–0.07)
Basophils Absolute: 0 10*3/uL (ref 0.0–0.1)
Basophils Relative: 0 %
Eosinophils Absolute: 0 10*3/uL (ref 0.0–0.5)
Eosinophils Relative: 0 %
HCT: 44.3 % (ref 36.0–46.0)
Hemoglobin: 15 g/dL (ref 12.0–15.0)
Lymphocytes Relative: 43 %
Lymphs Abs: 2 10*3/uL (ref 0.7–4.0)
MCH: 30.5 pg (ref 26.0–34.0)
MCHC: 33.9 g/dL (ref 30.0–36.0)
MCV: 90 fL (ref 80.0–100.0)
Monocytes Absolute: 0.5 10*3/uL (ref 0.1–1.0)
Monocytes Relative: 10 %
Neutro Abs: 2.2 10*3/uL (ref 1.7–7.7)
Neutrophils Relative %: 47 %
Platelets: 121 10*3/uL — ABNORMAL LOW (ref 150–400)
RBC: 4.92 MIL/uL (ref 3.87–5.11)
RDW: 12.5 % (ref 11.5–15.5)
WBC: 4.6 10*3/uL (ref 4.0–10.5)
nRBC: 0 % (ref 0.0–0.2)
nRBC: 0 /100 WBC

## 2022-04-02 LAB — RESP PANEL BY RT-PCR (RSV, FLU A&B, COVID)  RVPGX2
Influenza A by PCR: NEGATIVE
Influenza B by PCR: POSITIVE — AB
Resp Syncytial Virus by PCR: NEGATIVE
SARS Coronavirus 2 by RT PCR: NEGATIVE

## 2022-04-02 LAB — MAGNESIUM: Magnesium: 2.2 mg/dL (ref 1.7–2.4)

## 2022-04-02 NOTE — ED Triage Notes (Signed)
Patient reports migraine headache onset last week with productive cough and chest congestion .

## 2022-04-02 NOTE — ED Provider Triage Note (Signed)
Emergency Medicine Provider Triage Evaluation Note  Bianca Webb , a 21 y.o. female  was evaluated in triage.  Pt complains of migraine headache with onset last week.  She also reports productive cough and congestion.  She states that her migraine has made her feel weak, lightheaded, and fatigue.  Denies fever, chills, syncope, chest pain, shortness of breath.  Review of Systems  Positive: As above Negative: As above  Physical Exam  BP 109/83   Pulse (!) 108   Temp 98.9 F (37.2 C) (Oral)   Resp 16   LMP 03/31/2022   SpO2 97%  Gen:   Awake, no distress   Resp:  Normal effort  MSK:   Moves extremities without difficulty  Other:    Medical Decision Making  Medically screening exam initiated at 8:09 PM.  Appropriate orders placed.  Bianca Webb was informed that the remainder of the evaluation will be completed by another provider, this initial triage assessment does not replace that evaluation, and the importance of remaining in the ED until their evaluation is complete.     Lenard Simmer, [place] 04/02/22 2013

## 2022-04-04 ENCOUNTER — Telehealth: Payer: Self-pay

## 2022-04-04 ENCOUNTER — Ambulatory Visit (HOSPITAL_COMMUNITY)
Admission: EM | Admit: 2022-04-04 | Discharge: 2022-04-04 | Disposition: A | Discharge status: Home / Self Care | Payer: Medicaid Other | Attending: Emergency Medicine | Admitting: Emergency Medicine

## 2022-04-04 ENCOUNTER — Encounter (HOSPITAL_COMMUNITY): Payer: Self-pay | Admitting: Emergency Medicine

## 2022-04-04 DIAGNOSIS — J101 Influenza due to other identified influenza virus with other respiratory manifestations: Secondary | ICD-10-CM | POA: Diagnosis present

## 2022-04-04 DIAGNOSIS — Z113 Encounter for screening for infections with a predominantly sexual mode of transmission: Secondary | ICD-10-CM

## 2022-04-04 MED ORDER — IBUPROFEN 800 MG PO TABS: 800.0000 mg | ORAL_TABLET | Freq: Three times a day (TID) | ORAL | 0 refills | Status: DC

## 2022-04-04 MED ORDER — ONDANSETRON 4 MG PO TBDP: 4.0000 mg | ORAL_TABLET | Freq: Three times a day (TID) | ORAL | 0 refills | Status: DC | PRN
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
## 2022-04-04 NOTE — Discharge Instructions (Addendum)
Your symptoms today are most likely being caused by a virus and should steadily improve in time it can take up to 10 days before you truly start to see a turnaround however things will get better  You may use ibuprofen 800 mg in addition to Tylenol 500 to 1000 mg every 8 hours for management of your headaches  You may use Zofran every 8 hours to help calm vomiting, wait at least 30 minutes after use to begin eating or drinking, increase your fluid intake until you are able to tolerate foods as normal, start eating with a bland diet to avoid stomach irritation  Normal potassium levels 3.5-4, your level in the ED on December 25 was 3.3, no concern at this time will most likely go to normal level when you are eating and drinking as normal    You can take Tylenol and/or Ibuprofen as needed for fever reduction and pain relief.   For cough: honey 1/2 to 1 teaspoon (you can dilute the honey in water or another fluid).  You can also use guaifenesin and dextromethorphan for cough. You can use a humidifier for chest congestion and cough.  If you don't have a humidifier, you can sit in the bathroom with the hot shower running.      For sore throat: try warm salt water gargles, cepacol lozenges, throat spray, warm tea or water with lemon/honey, popsicles or ice, or OTC cold relief medicine for throat discomfort.   For congestion: take a daily anti-histamine like Zyrtec, Claritin, and a oral decongestant, such as pseudoephedrine.  You can also use Flonase 1-2 sprays in each nostril daily.   It is important to stay hydrated: drink plenty of fluids (water, gatorade/powerade/pedialyte, juices, or teas) to keep your throat moisturized and help further relieve irritation/discomfort.  Labs pending 2-3 days, you will be contacted if positive for any sti and treatment will be sent to the pharmacy, you will have to return to the clinic if positive for gonorrhea to receive treatment   Please refrain from having sex  until labs results, if positive please refrain from having sex until treatment complete and symptoms resolve   If positive for Chlamydia  gonorrhea or trichomoniasis please notify partner or partners so they may tested as well  Moving forward, it is recommended you use some form of protection against the transmission of sti infections  such as condoms or dental dams with each sexual encounter

## 2022-04-04 NOTE — ED Triage Notes (Signed)
Pt went to ED 12/25 for headache/migraine, lightheaded, n/v that had started on Thursday. Left before being seen due to long wait time.  Reports was positive for flu and potassium was low 3.3.  Denies pain now, Pt also wanting STD testing-denies s/s or exposure.

## 2022-04-04 NOTE — Telephone Encounter (Signed)
Transition Care Management Follow-up Telephone Call Date of discharge and from where: 04/02/22 @[facility]  [facility] EMERGENCY DEPARTMENT   How have you been since you were released from the hospital? Not felling well, pt sated because of that she is at the urgent care  Any questions or concerns? Yes  Items Reviewed: Did the pt receive and understand the discharge instructions provided?  N/A Medications obtained and verified? Yes  Other?  N/A Any new allergies since your discharge? No  Dietary orders reviewed? Yes Do you have support at home? Yes   Home Care and Equipment/Supplies: Were home health services ordered? not applicable If so, what is the name of the agency? N/A  Has the agency set up a time to come to the patient's home? not applicable Were any new equipment or medical supplies ordered?  No What is the name of the medical supply agency? N/A Were you able to get the supplies/equipment? not applicable Do you have any questions related to the use of the equipment or supplies? No  Functional Questionnaire: (I = Independent and D = Dependent) ADLs: I  Bathing/Dressing- I  Meal Prep- I  Eating- I  Maintaining continence- I  Transferring/Ambulation- I  Managing Meds- I  Follow up appointments reviewed:  PCP Hospital f/u appt confirmed? Yes  Scheduled to see [facility] NP on 04/11/22 @ 10:00am. Specialist Hospital f/u appt confirmed? No  Scheduled to see N/A Are transportation arrangements needed? No  If their condition worsens, is the pt aware to call PCP or go to the Emergency Dept.? Yes Was the patient provided with contact information for the PCP's office or ED? Yes Was to pt encouraged to call back with questions or concerns? Yes

## 2022-04-05 LAB — CERVICOVAGINAL ANCILLARY ONLY
Bacterial Vaginitis (gardnerella): POSITIVE — AB
Candida Glabrata: NEGATIVE
Candida Vaginitis: NEGATIVE
Chlamydia: NEGATIVE
Comment: NEGATIVE
Comment: NEGATIVE
Comment: NEGATIVE
Comment: NEGATIVE
Comment: NEGATIVE
Comment: NORMAL
Neisseria Gonorrhea: NEGATIVE
Trichomonas: NEGATIVE

## 2022-04-06 ENCOUNTER — Telehealth (HOSPITAL_COMMUNITY): Payer: Self-pay | Admitting: Emergency Medicine

## 2022-04-06 MED ORDER — METRONIDAZOLE 500 MG PO TABS: 500.0000 mg | ORAL_TABLET | Freq: Two times a day (BID) | ORAL | 0 refills | Status: DC

## 2022-04-11 ENCOUNTER — Ambulatory Visit: Payer: Medicaid Other | Admitting: Family Medicine

## 2022-08-09 ENCOUNTER — Ambulatory Visit: Payer: Medicaid Other | Admitting: Family Medicine

## 2022-08-10 ENCOUNTER — Encounter: Payer: Self-pay | Admitting: Family Medicine

## 2022-08-10 ENCOUNTER — Ambulatory Visit (INDEPENDENT_AMBULATORY_CARE_PROVIDER_SITE_OTHER): Payer: Medicaid Other | Admitting: Family Medicine

## 2022-08-10 VITALS — BP 96/72 | HR 70 | Temp 97.8°F | Ht 62.0 in | Wt 156.0 lb

## 2022-08-10 DIAGNOSIS — E876 Hypokalemia: Secondary | ICD-10-CM | POA: Diagnosis not present

## 2022-08-10 DIAGNOSIS — Z7251 High risk heterosexual behavior: Secondary | ICD-10-CM | POA: Diagnosis not present

## 2022-08-10 DIAGNOSIS — Z113 Encounter for screening for infections with a predominantly sexual mode of transmission: Secondary | ICD-10-CM

## 2022-08-10 DIAGNOSIS — D696 Thrombocytopenia, unspecified: Secondary | ICD-10-CM | POA: Diagnosis not present

## 2022-08-10 LAB — CBC WITH DIFFERENTIAL/PLATELET
Basophils Absolute: 0 10*3/uL (ref 0.0–0.1)
Basophils Relative: 0.5 % (ref 0.0–3.0)
Eosinophils Absolute: 0.1 10*3/uL (ref 0.0–0.7)
Eosinophils Relative: 1.6 % (ref 0.0–5.0)
HCT: 41.2 % (ref 36.0–46.0)
Hemoglobin: 13.9 g/dL (ref 12.0–15.0)
Lymphocytes Relative: 48.4 % — ABNORMAL HIGH (ref 12.0–46.0)
Lymphs Abs: 2.2 10*3/uL (ref 0.7–4.0)
MCHC: 33.7 g/dL (ref 30.0–36.0)
MCV: 92 fl (ref 78.0–100.0)
Monocytes Absolute: 0.4 10*3/uL (ref 0.1–1.0)
Monocytes Relative: 8.1 % (ref 3.0–12.0)
Neutro Abs: 1.8 10*3/uL (ref 1.4–7.7)
Neutrophils Relative %: 41.4 % — ABNORMAL LOW (ref 43.0–77.0)
Platelets: 146 10*3/uL — ABNORMAL LOW (ref 150.0–400.0)
RBC: 4.48 Mil/uL (ref 3.87–5.11)
RDW: 14.1 % (ref 11.5–15.5)
WBC: 4.4 10*3/uL (ref 4.0–10.5)

## 2022-08-10 LAB — BASIC METABOLIC PANEL
BUN: 17 mg/dL (ref 6–23)
CO2: 25 mEq/L (ref 19–32)
Calcium: 9.3 mg/dL (ref 8.4–10.5)
Chloride: 105 mEq/L (ref 96–112)
Creatinine, Ser: 0.78 mg/dL (ref 0.40–1.20)
GFR: 106.85 mL/min (ref >60.00)
Glucose, Bld: 67 mg/dL — ABNORMAL LOW (ref 70–99)
Potassium: 4.3 mEq/L (ref 3.5–5.1)
Sodium: 138 mEq/L (ref 135–145)

## 2022-08-10 LAB — HCG, QUANTITATIVE, PREGNANCY: Quantitative HCG: <0.6 m[IU]/mL

## 2022-08-10 NOTE — Addendum Note (Signed)
Addended by: Avanell Shackleton on: 08/10/2022 10:37 AM   Modules accepted: Orders

## 2022-08-10 NOTE — Progress Notes (Signed)
Subjective:     Patient ID: Bianca Webb, female    DOB: [birth year], 24 y.o.   MRN: [id number]  Chief Complaint  Patient presents with   std screening    HPI Patient is in today for STD testing. States she had unprotected sex a couple of weeks ago. Denies any symptoms. Is not on birth control and does not want to be on it.   Hx of hypokalemia and thrombocytopenia per record review.  Health Maintenance Due  Topic Date Due   HPV VACCINES (1 - 2-dose series) Never done   Hepatitis C Screening  Never done   PAP-Cervical Cytology Screening  Never done   PAP SMEAR-Modifier  Never done    Past Medical History:  Diagnosis Date   Pregnant     Past Surgical History:  Procedure Laterality Date   NO PAST SURGERIES      Family History  Problem Relation Age of Onset   Healthy Mother    Healthy Father    Cancer Neg Hx    Hypertension Neg Hx    Eclampsia Neg Hx    Diabetes Neg Hx     Social History   Socioeconomic History   Marital status: Single    Spouse name: Not on file   Webb of children: Not on file   Years of education: Not on file   Highest education level: Not on file  Occupational History   Not on file  Tobacco Use   Smoking status: Never   Smokeless tobacco: Never  Vaping Use   Vaping Use: Never used  Substance and Sexual Activity   Alcohol use: No   Drug use: Yes    Frequency: 4.0 times per week    Types: Marijuana   Sexual activity: Yes    Birth control/protection: None  Other Topics Concern   Not on file  Social History Narrative   ** Merged History Encounter **       Social Determinants of Health   Financial Resource Strain: Not on file  Food Insecurity: Not on file  Transportation Needs: Not on file  Physical Activity: Not on file  Stress: Not on file  Social Connections: Not on file  Intimate Partner Violence: Not on file    Outpatient Medications Prior to Visit  Medication Sig Dispense Refill   cetirizine (ZYRTEC  ALLERGY) 10 MG tablet Take 1 tablet (10 mg total) by mouth daily. 30 tablet 2   ibuprofen (ADVIL) 800 MG tablet Take 1 tablet (800 mg total) by mouth 3 (three) times daily. 21 tablet 0   naproxen (NAPROSYN) 375 MG tablet Take 1 tablet (375 mg total) by mouth 2 (two) times daily. 20 tablet 0   ondansetron (ZOFRAN-ODT) 4 MG disintegrating tablet Take 1 tablet (4 mg total) by mouth every 8 (eight) hours as needed for nausea or vomiting. 20 tablet 0   metroNIDAZOLE (FLAGYL) 500 MG tablet Take 1 tablet (500 mg total) by mouth 2 (two) times daily. (Patient not taking: Reported on 08/10/2022) 14 tablet 0   No facility-administered medications prior to visit.    No Known Allergies  Review of Systems  Constitutional:  Negative for chills, fever, malaise/fatigue and weight loss.  Respiratory:  Negative for shortness of breath.   Cardiovascular:  Negative for chest pain, palpitations and leg swelling.  Gastrointestinal:  Negative for abdominal pain, constipation, diarrhea, nausea and vomiting.  Genitourinary:  Negative for dysuria, frequency and urgency.  Neurological:  Negative for dizziness.  Objective:    Physical Exam Constitutional:      General: She is not in acute distress.    Appearance: She is not ill-appearing.  Eyes:     Extraocular Movements: Extraocular movements intact.     Conjunctiva/sclera: Conjunctivae normal.  Cardiovascular:     Rate and Rhythm: Normal rate.  Pulmonary:     Effort: Pulmonary effort is normal.  Musculoskeletal:     Cervical back: Normal range of motion and neck supple.  Skin:    General: Skin is warm and dry.  Neurological:     General: No focal deficit present.     Mental Status: She is alert and oriented to person, place, and time.  Psychiatric:        Mood and Affect: Mood normal.        Behavior: Behavior normal.        Thought Content: Thought content normal.     BP 96/72 (BP Location: Left Arm, Patient Position: Sitting, Cuff Size:  Large)   Pulse 70   Temp 97.8 F (36.6 C) (Temporal)   Ht 5\' 2"  (1.575 m)   Wt 156 lb (70.8 kg)   SpO2 98%   Breastfeeding No   BMI 28.53 kg/m  Wt Readings from Last 3 Encounters:  08/10/22 156 lb (70.8 kg)  06/26/21 135 lb (61.2 kg)  04/03/21 138 lb 6.4 oz (62.8 kg)       Assessment & Plan:   Problem List Items Addressed This Visit   None Visit Diagnoses     Encounter for screening examination for sexually transmitted disease    -  Primary   Relevant Orders   Hepatitis C antibody   Hepatitis B surface antigen   Trichomonas vaginalis, RNA   RPR   HIV Antibody (routine testing w rflx)   GC/Chlamydia Probe Amp   Unprotected sex       Relevant Orders   Hepatitis C antibody   Hepatitis B surface antigen   Trichomonas vaginalis, RNA   RPR   HIV Antibody (routine testing w rflx)   GC/Chlamydia Probe Amp   hCG, quantitative, pregnancy   Hypokalemia       Relevant Orders   Basic metabolic panel   Thrombocytopenia (HCC)       Relevant Orders   CBC with Differential/Platelet      She is here requesting STD testing.  Recent unprotected sex.  Recommend safe sex and a handout was provided.  Declines birth control.  Chart review shows hypokalemia and thrombocytopenia.  Recheck this today. Follow-up pending lab results.  I have discontinued Meilyn O. Wixon's metroNIDAZOLE. I am also having her maintain her cetirizine, naproxen, ondansetron, and ibuprofen.  No orders of the defined types were placed in this encounter.

## 2022-08-11 LAB — HEPATITIS B SURFACE ANTIGEN: Hepatitis B Surface Ag: NONREACTIVE

## 2022-08-11 LAB — RPR: RPR Ser Ql: NONREACTIVE

## 2022-08-11 LAB — HIV ANTIBODY (ROUTINE TESTING W REFLEX): HIV 1&2 Ab, 4th Generation: NONREACTIVE

## 2022-08-11 LAB — HEPATITIS C ANTIBODY: Hepatitis C Ab: NONREACTIVE

## 2022-08-14 LAB — GC/CHLAMYDIA PROBE AMP
Chlamydia trachomatis, NAA: NEGATIVE
Neisseria Gonorrhoeae by PCR: NEGATIVE

## 2022-10-10 ENCOUNTER — Ambulatory Visit: Payer: Medicaid Other | Admitting: Family Medicine

## 2022-12-06 ENCOUNTER — Ambulatory Visit: Payer: Medicaid Other

## 2022-12-06 ENCOUNTER — Ambulatory Visit (INDEPENDENT_AMBULATORY_CARE_PROVIDER_SITE_OTHER): Payer: Medicaid Other | Admitting: Family Medicine

## 2022-12-06 ENCOUNTER — Encounter: Payer: Self-pay | Admitting: Family Medicine

## 2022-12-06 VITALS — BP 100/76 | HR 69 | Temp 97.5°F | Ht 62.0 in | Wt 156.6 lb

## 2022-12-06 DIAGNOSIS — Z113 Encounter for screening for infections with a predominantly sexual mode of transmission: Secondary | ICD-10-CM | POA: Insufficient documentation

## 2022-12-06 DIAGNOSIS — N898 Other specified noninflammatory disorders of vagina: Secondary | ICD-10-CM | POA: Diagnosis not present

## 2022-12-06 NOTE — Progress Notes (Signed)
Subjective:     Patient ID: Bianca Webb, female    DOB: 2000-07-01, 22 y.o.   MRN: [id number]  Chief Complaint  Patient presents with   Check Up     HPI  Discussed the use of AI scribe software for clinical note transcription with the patient, who gave verbal consent to proceed.  History of Present Illness         C/o vaginal discharge. New partner since May when she was last tested for STDs.  No urinary symptoms.    Health Maintenance Due  Topic Date Due   HPV VACCINES (1 - 3-dose series) Never done   PAP-Cervical Cytology Screening  Never done   PAP SMEAR-Modifier  Never done   COVID-19 Vaccine (1 - 2023-24 season) Never done   INFLUENZA VACCINE  11/08/2022    Past Medical History:  Diagnosis Date   Pregnant     Past Surgical History:  Procedure Laterality Date   NO PAST SURGERIES      Family History  Problem Relation Age of Onset   Healthy Mother    Healthy Father    Cancer Neg Hx    Hypertension Neg Hx    Eclampsia Neg Hx    Diabetes Neg Hx     Social History   Socioeconomic History   Marital status: Single    Spouse name: Not on file   Webb of children: Not on file   Years of education: Not on file   Highest education level: 12th grade  Occupational History   Not on file  Tobacco Use   Smoking status: Every Day    Types: Cigarettes   Smokeless tobacco: Never  Vaping Use   Vaping status: Never Used  Substance and Sexual Activity   Alcohol use: No   Drug use: Yes    Frequency: 4.0 times per week    Types: Marijuana   Sexual activity: Yes    Birth control/protection: None  Other Topics Concern   Not on file  Social History Narrative   ** Merged History Encounter **       Social Determinants of Health   Financial Resource Strain: Medium Risk (10/07/2022)   Overall Financial Resource Strain (CARDIA)    Difficulty of Paying Living Expenses: Somewhat hard  Food Insecurity: No Food Insecurity (10/07/2022)   Hunger Vital Sign     Worried About Running Out of Food in the Last Year: Never true    Ran Out of Food in the Last Year: Never true  Transportation Needs: No Transportation Needs (10/07/2022)   PRAPARE - Administrator, Civil Service (Medical): No    Lack of Transportation (Non-Medical): No  Physical Activity: Unknown (10/07/2022)   Exercise Vital Sign    Days of Exercise per Week: 0 days    Minutes of Exercise per Session: Not on file  Stress: No Stress Concern Present (10/07/2022)   [employer] of Occupational Health - Occupational Stress Questionnaire    Feeling of Stress : Not at all  Social Connections: Unknown (10/07/2022)   Social Connection and Isolation Panel [NHANES]    Frequency of Communication with Friends and Family: More than three times a week    Frequency of Social Gatherings with Friends and Family: Never    Attends Religious Services: Never    Database administrator or Organizations: No    Attends Engineer, structural: Not on file    Marital Status: Patient declined  Intimate Partner Violence:  Unknown (07/13/2021)   Received from [facility]   HITS    Physically Hurt: Not on file    Insult or Talk Down To: Not on file    Threaten Physical Harm: Not on file    Scream or Curse: Not on file    Outpatient Medications Prior to Visit  Medication Sig Dispense Refill   ibuprofen (ADVIL) 800 MG tablet Take 1 tablet (800 mg total) by mouth 3 (three) times daily. (Patient taking differently: Take 800 mg by mouth as needed.) 21 tablet 0   cetirizine (ZYRTEC ALLERGY) 10 MG tablet Take 1 tablet (10 mg total) by mouth daily. 30 tablet 2   naproxen (NAPROSYN) 375 MG tablet Take 1 tablet (375 mg total) by mouth 2 (two) times daily. (Patient not taking: Reported on 12/06/2022) 20 tablet 0   ondansetron (ZOFRAN-ODT) 4 MG disintegrating tablet Take 1 tablet (4 mg total) by mouth every 8 (eight) hours as needed for nausea or vomiting. (Patient not taking: Reported  on 12/06/2022) 20 tablet 0   No facility-administered medications prior to visit.    No Known Allergies  Review of Systems  Constitutional:  Negative for chills and fever.  Gastrointestinal:  Negative for abdominal pain, constipation, diarrhea, nausea and vomiting.  Genitourinary:  Negative for dysuria, frequency and urgency.       Objective:    Physical Exam Constitutional:      General: She is not in acute distress.    Appearance: She is not ill-appearing.  Eyes:     Extraocular Movements: Extraocular movements intact.     Conjunctiva/sclera: Conjunctivae normal.  Cardiovascular:     Rate and Rhythm: Normal rate.  Pulmonary:     Effort: Pulmonary effort is normal.  Musculoskeletal:     Cervical back: Normal range of motion and neck supple.  Skin:    General: Skin is warm and dry.  Neurological:     General: No focal deficit present.     Mental Status: She is alert and oriented to person, place, and time.  Psychiatric:        Mood and Affect: Mood normal.        Behavior: Behavior normal.        Thought Content: Thought content normal.      BP 100/76 (BP Location: Left Arm, Patient Position: Sitting, Cuff Size: Normal)   Pulse 69   Temp (!) 97.5 F (36.4 C) (Temporal)   Ht 5\' 2"  (1.575 m)   Wt 156 lb 9.6 oz (71 kg)   LMP 11/14/2022   SpO2 100%   BMI 28.64 kg/m  Wt Readings from Last 3 Encounters:  12/06/22 156 lb 9.6 oz (71 kg)  08/10/22 156 lb (70.8 kg)  06/26/21 135 lb (61.2 kg)       Assessment & Plan:   Problem List Items Addressed This Visit       Other   Screen for STD (sexually transmitted disease)   Relevant Orders   Cervicovaginal ancillary only( [place])   Hepatitis C antibody   HIV Antibody (routine testing w rflx)   Hepatitis B surface antigen   RPR   Vaginal discharge - Primary   Relevant Orders   Cervicovaginal ancillary only( [place])   Unfortunately, the lab is closed and I cannot get appropriate tests. She will  return tomorrow for a nurse visit and will have STD testing. Orders are in.  She is overdue for pap smear. Recommend OB/GYN visit.   I have  discontinued Claudeen O. Zink's cetirizine, naproxen, and ondansetron. I am also having her maintain her ibuprofen.  No orders of the defined types were placed in this encounter.

## 2022-12-08 LAB — RPR: RPR Ser Ql: NONREACTIVE

## 2022-12-08 LAB — HIV ANTIBODY (ROUTINE TESTING W REFLEX): HIV 1&2 Ab, 4th Generation: NONREACTIVE

## 2022-12-08 LAB — HEPATITIS C ANTIBODY: Hepatitis C Ab: NONREACTIVE

## 2022-12-08 LAB — HEPATITIS B SURFACE ANTIGEN: Hepatitis B Surface Ag: NONREACTIVE

## 2023-01-14 ENCOUNTER — Other Ambulatory Visit (HOSPITAL_COMMUNITY)
Admission: RE | Admit: 2023-01-14 | Discharge: 2023-01-14 | Disposition: A | Discharge status: Home / Self Care | Payer: Medicaid Other | Source: Ambulatory Visit | Attending: Family Medicine | Admitting: Family Medicine

## 2023-01-14 ENCOUNTER — Encounter: Payer: Self-pay | Admitting: Family Medicine

## 2023-01-14 ENCOUNTER — Ambulatory Visit: Payer: Medicaid Other | Admitting: Family Medicine

## 2023-01-14 VITALS — BP 102/63 | HR 66 | Ht 62.0 in | Wt 160.6 lb

## 2023-01-14 DIAGNOSIS — R109 Unspecified abdominal pain: Secondary | ICD-10-CM | POA: Diagnosis present

## 2023-01-14 DIAGNOSIS — G8929 Other chronic pain: Secondary | ICD-10-CM | POA: Diagnosis present

## 2023-01-14 DIAGNOSIS — Z124 Encounter for screening for malignant neoplasm of cervix: Secondary | ICD-10-CM

## 2023-01-14 DIAGNOSIS — R350 Frequency of micturition: Secondary | ICD-10-CM

## 2023-01-14 NOTE — Assessment & Plan Note (Signed)
Increased urinary frequency for the last 3 weeks.  Urinalysis and urine culture collected.  Will treat as needed.

## 2023-01-14 NOTE — Assessment & Plan Note (Signed)
Pap smear collected today 

## 2023-01-14 NOTE — Progress Notes (Signed)
NGYN presents to establish care. Pt c/o abd pain for 2 months. Pain goes away when on periods. No PAP Hx. Requesting PAP and STD testing

## 2023-01-14 NOTE — Progress Notes (Signed)
    SUBJECTIVE:   CHIEF COMPLAINT / HPI:   Establish care Patient presents to establish care with gynecologist.  Is interested in getting pregnant in the near future.  Has never had a Pap smear completed.  Chronic abdominal pain Patient reports abdominal pain off and on for the last several years.  Worse in her right upper quadrant and left upper quadrant.  Pain comes and goes.  She reports she has a bowel movement every 2 to 3 days and it is hard and she does have issues with constipation.  Denies any abnormal vaginal discharge although she does report she has had issues with chronic BV.  No known contact with an STD but would like to be tested for STDs.  Is not interested in blood test today.  Increased urinary frequency Patient reports increased urinary frequency for the last 3 weeks.  Reports that she will pee and 15 minutes later have to go again.  Feels like she is emptying her bladder completely but is not sure.  Reports uncomfortable when urinating but not exactly painful.   OBJECTIVE:   BP 102/63   Pulse 66   Ht 5\' 2"  (1.575 m)   Wt 160 lb 9.6 oz (72.8 kg)   LMP 01/09/2023   BMI 29.37 kg/m   General: Well-appearing 22 year old female in no acute distress Cardiac: Regular rate Respiratory: Normal work of breathing, speaking in full sentences Abdomen: Soft, mild tenderness noted in upper quadrants bilaterally.  What feels like stool burden appreciated GU: Normal external vaginal tissue, patient is on the end of menses so minimal amount of blood appreciated.  Cervix is nonfriable, nontender.  ASSESSMENT/PLAN:   Increased urinary frequency Increased urinary frequency for the last 3 weeks.  Urinalysis and urine culture collected.  Will treat as needed.  Chronic abdominal pain Patient with chronic abdominal pain for the last several years.  Given symptoms described likely related to constipation.  Discussed use of over-the-counter MiraLAX which patient will start using.  Also  discussed keeping a symptom diary and return as needed.  GC/chlamydia and wet prep collected today.  Cervical cancer screening Pap smear collected today.     Celedonio Savage, MD [facility]

## 2023-01-14 NOTE — Assessment & Plan Note (Addendum)
Patient with chronic abdominal pain for the last several years.  Given symptoms described likely related to constipation.  Discussed use of over-the-counter MiraLAX which patient will start using.  Also discussed keeping a symptom diary and return as needed.  GC/chlamydia and wet prep collected today.

## 2023-01-14 NOTE — Addendum Note (Signed)
Addended by: Jearld Adjutant on: 01/14/2023 11:27 AM   Modules accepted: Orders

## 2023-01-15 LAB — URINALYSIS, ROUTINE W REFLEX MICROSCOPIC
Bilirubin, UA: NEGATIVE
Glucose, UA: NEGATIVE
Ketones, UA: NEGATIVE
Nitrite, UA: NEGATIVE
RBC, UA: NEGATIVE
Specific Gravity, UA: 1.023 (ref 1.005–1.030)
Urobilinogen, Ur: 0.2 mg/dL (ref 0.2–1.0)
pH, UA: 6.5 (ref 5.0–7.5)

## 2023-01-15 LAB — CERVICOVAGINAL ANCILLARY ONLY
Bacterial Vaginitis (gardnerella): POSITIVE — AB
Candida Glabrata: NEGATIVE
Candida Vaginitis: POSITIVE — AB
Chlamydia: NEGATIVE
Comment: NEGATIVE
Comment: NEGATIVE
Comment: NEGATIVE
Comment: NEGATIVE
Comment: NEGATIVE
Comment: NORMAL
Neisseria Gonorrhea: NEGATIVE
Trichomonas: NEGATIVE

## 2023-01-15 LAB — MICROSCOPIC EXAMINATION
Casts: NONE SEEN /[LPF]
RBC, Urine: NONE SEEN /[HPF] (ref 0–2)

## 2023-01-16 ENCOUNTER — Telehealth: Payer: Self-pay | Admitting: Family Medicine

## 2023-01-16 LAB — CYTOLOGY - PAP
Chlamydia: NEGATIVE
Comment: NEGATIVE
Comment: NEGATIVE
Comment: NORMAL
Diagnosis: NEGATIVE
Neisseria Gonorrhea: NEGATIVE
Trichomonas: NEGATIVE

## 2023-01-16 LAB — URINE CULTURE

## 2023-01-16 MED ORDER — NITROFURANTOIN MONOHYD MACRO 100 MG PO CAPS: 100.0000 mg | ORAL_CAPSULE | Freq: Two times a day (BID) | ORAL | 0 refills | Status: DC

## 2023-01-16 MED ORDER — METRONIDAZOLE 500 MG PO TABS
500.0000 mg | ORAL_TABLET | Freq: Two times a day (BID) | ORAL | 0 refills | Status: AC
Start: 2023-01-16 — End: 2023-01-23

## 2023-01-16 MED ORDER — FLUCONAZOLE 150 MG PO TABS
150.0000 mg | ORAL_TABLET | Freq: Once | ORAL | 0 refills | Status: AC
Start: 2023-01-16 — End: 2023-01-16

## 2023-01-16 NOTE — Addendum Note (Signed)
Addended by: Celedonio Savage on: 01/16/2023 01:33 PM   Modules accepted: Orders

## 2023-01-16 NOTE — Telephone Encounter (Signed)
Called patient regarding UTI results.  Sent prescription for Macrobid and to patient's pharmacy and discussed the BV and yeast as well.  No questions or concerns.

## 2023-01-16 NOTE — Addendum Note (Signed)
Addended by: Celedonio Savage on: 01/16/2023 02:46 PM   Modules accepted: Orders

## 2023-03-14 ENCOUNTER — Encounter: Payer: Self-pay | Admitting: Family Medicine

## 2023-04-05 ENCOUNTER — Ambulatory Visit (HOSPITAL_COMMUNITY): Payer: Self-pay

## 2023-04-14 ENCOUNTER — Encounter (HOSPITAL_COMMUNITY): Payer: Self-pay | Admitting: Emergency Medicine

## 2023-04-14 ENCOUNTER — Ambulatory Visit (HOSPITAL_COMMUNITY)
Admission: EM | Admit: 2023-04-14 | Discharge: 2023-04-14 | Disposition: A | Discharge status: Home / Self Care | Payer: Medicaid Other

## 2023-04-14 DIAGNOSIS — H65192 Other acute nonsuppurative otitis media, left ear: Secondary | ICD-10-CM

## 2023-04-14 MED ORDER — AMOXICILLIN-POT CLAVULANATE 875-125 MG PO TABS
1.0000 | ORAL_TABLET | Freq: Two times a day (BID) | ORAL | 0 refills | Status: AC
Start: 2023-04-14 — End: 2023-04-21

## 2023-04-14 MED ORDER — IBUPROFEN 800 MG PO TABS
800.0000 mg | ORAL_TABLET | Freq: Once | ORAL | Status: AC
  Administered 2023-04-14: 800 mg via ORAL

## 2023-04-14 MED ORDER — IBUPROFEN 800 MG PO TABS
ORAL_TABLET | ORAL | Status: AC
  Filled 2023-04-14: qty 1

## 2023-04-14 MED ORDER — IBUPROFEN 800 MG PO TABS
800.0000 mg | ORAL_TABLET | Freq: Three times a day (TID) | ORAL | 0 refills | Status: DC | PRN
Start: 2023-04-14 — End: 2023-06-20

## 2023-04-14 NOTE — Discharge Instructions (Signed)
 Good to meet you today.  You have a left ear infection.  Please take the Augmentin  as directed.  Alternate Tylenol  and ibuprofen  in the next 24 to 48 hours to help with pain control.  You were given ibuprofen  800 mg here today.  Please follow-up with your primary care if symptoms are not improving by the middle of this week.

## 2023-04-14 NOTE — ED Provider Notes (Signed)
 [facility]   MRN: [id number] DOB: 07-Jun-2000  Subjective:   Bianca Webb is a 23 y.o. female presenting for acute onset of significant left ear pain in the last 24 hours.  Patient reports that she was seen at an urgent care yesterday for upper respiratory symptoms.  She was told to take brompheniramine and Flonase , but states her ear started hurting after she took this medicine.  She denies any fever or chills.  No hearing loss.  No dizziness or headache.  No ringing in the ear.  No drainage from the ear.  No current facility-administered medications for this encounter.  Current Outpatient Medications:    amoxicillin -clavulanate (AUGMENTIN ) 875-125 MG tablet, Take 1 tablet by mouth 2 (two) times daily for 7 days. Take with food., Disp: 14 tablet, Rfl: 0   brompheniramine-pseudoephedrine-DM 30-2-10 MG/5ML syrup, Take 5 mLs by mouth 4 (four) times daily as needed., Disp: , Rfl:    fluticasone  (FLONASE ) 50 MCG/ACT nasal spray, Place 2 sprays into both nostrils daily., Disp: , Rfl:    ibuprofen  (ADVIL ) 800 MG tablet, Take 1 tablet (800 mg total) by mouth 3 (three) times daily. (Patient not taking: Reported on 01/14/2023), Disp: 21 tablet, Rfl: 0   nitrofurantoin , macrocrystal-monohydrate, (MACROBID ) 100 MG capsule, Take 1 capsule (100 mg total) by mouth 2 (two) times daily. (Patient not taking: Reported on 04/14/2023), Disp: 10 capsule, Rfl: 0   No Known Allergies  Past Medical History:  Diagnosis Date   Pregnant      Past Surgical History:  Procedure Laterality Date   NO PAST SURGERIES      Family History  Problem Relation Age of Onset   Healthy Mother    Healthy Father    Cancer Neg Hx    Hypertension Neg Hx    Eclampsia Neg Hx    Diabetes Neg Hx     Social History   Tobacco Use   Smoking status: Former    Current packs/day: 0.00    Types: Cigarettes    Quit date: 2023    Years since quitting: 2.0   Smokeless tobacco: Never  Vaping Use   Vaping  status: Never Used  Substance Use Topics   Alcohol use: No   Drug use: Yes    Frequency: 4.0 times per week    Types: Marijuana    ROS REFER TO HPI FOR PERTINENT POSITIVES AND NEGATIVES   Objective:   Vitals: BP 109/73 (BP Location: Right Arm)   Pulse 80   Temp 98.2 F (36.8 C) (Oral)   Resp 17   LMP 04/13/2023 (Exact Date)   SpO2 98%   Physical Exam Vitals and nursing note reviewed.  Constitutional:      General: She is not in acute distress.    Appearance: Normal appearance. She is not ill-appearing.  HENT:     Head: Normocephalic.     Right Ear: Tympanic membrane, ear canal and external ear normal.     Left Ear: Ear canal and external ear normal. A middle ear effusion is present. Tympanic membrane is erythematous.     Nose: No congestion.     Mouth/Throat:     Mouth: Mucous membranes are moist.     Pharynx: No oropharyngeal exudate or posterior oropharyngeal erythema.  Eyes:     Extraocular Movements: Extraocular movements intact.     Conjunctiva/sclera: Conjunctivae normal.     Pupils: Pupils are equal, round, and reactive to light.  Cardiovascular:     Rate  and Rhythm: Normal rate and regular rhythm.     Pulses: Normal pulses.     Heart sounds: Normal heart sounds. No murmur heard. Pulmonary:     Effort: Pulmonary effort is normal. No respiratory distress.     Breath sounds: Normal breath sounds. No wheezing.  Musculoskeletal:     Cervical back: Normal range of motion.  Skin:    General: Skin is warm.  Neurological:     Mental Status: She is alert and oriented to person, place, and time.  Psychiatric:        Mood and Affect: Mood normal.        Behavior: Behavior normal.     No results found for this or any previous visit (from the past 24 hours).  Assessment and Plan :   PDMP not reviewed this encounter.  1. Acute otitis media with effusion of left ear    Acute left otitis media with effusion, no rupture.  She was given ibuprofen  800 mg in the  urgent care today to help with pain control.  She will start on Augmentin  as directed at home.  She can alternate ibuprofen  and Tylenol  at home to help with pain control. Instructed to follow-up with her PCP if not improving this week. Pt aware of risks vs benefits and possible adverse reactions.     AllwardtMardy HERO, PA-C 04/14/23 1038

## 2023-04-14 NOTE — ED Triage Notes (Addendum)
 Pt c/o ear pain for 1 day [facility] was [facility] and given meds for URI. States she took medication (brompheniramine and flonase) one time and her left ear began hurting.
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
## 2023-06-20 ENCOUNTER — Encounter (HOSPITAL_COMMUNITY): Payer: Self-pay

## 2023-06-20 ENCOUNTER — Ambulatory Visit (HOSPITAL_COMMUNITY)
Admission: RE | Admit: 2023-06-20 | Discharge: 2023-06-20 | Disposition: A | Discharge status: Home / Self Care | Source: Ambulatory Visit | Attending: Family Medicine | Admitting: Family Medicine

## 2023-06-20 VITALS — BP 103/66 | HR 71 | Temp 98.2°F | Resp 16 | Ht 62.0 in | Wt 165.0 lb

## 2023-06-20 DIAGNOSIS — N898 Other specified noninflammatory disorders of vagina: Secondary | ICD-10-CM | POA: Insufficient documentation

## 2023-06-20 MED ORDER — METRONIDAZOLE 0.75 % VA GEL: 1.0000 | Freq: Every day | VAGINAL | 0 refills | Status: AC

## 2023-06-20 NOTE — ED Triage Notes (Signed)
 Patient presenting with vaginal itching onset 2 weeks ago. Denies any pain or discharge.   Prescriptions or OTC medications tried: No
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
## 2023-06-20 NOTE — Discharge Instructions (Signed)
 You were seen today for vaginal itching.  The swab will be resulted tomorrow and you will be notified of results.  I have sent out the vaginal gel for treatment of BV today.  If anything else needs to be treated you will be notified tomorrow.

## 2023-06-21 ENCOUNTER — Telehealth (HOSPITAL_COMMUNITY): Payer: Self-pay

## 2023-06-21 LAB — CERVICOVAGINAL ANCILLARY ONLY
Bacterial Vaginitis (gardnerella): POSITIVE — AB
Candida Glabrata: NEGATIVE
Candida Vaginitis: POSITIVE — AB
Chlamydia: NEGATIVE
Comment: NEGATIVE
Comment: NEGATIVE
Comment: NEGATIVE
Comment: NEGATIVE
Comment: NEGATIVE
Comment: NORMAL
Neisseria Gonorrhea: NEGATIVE
Trichomonas: NEGATIVE

## 2023-06-21 MED ORDER — FLUCONAZOLE 150 MG PO TABS: 150.0000 mg | ORAL_TABLET | Freq: Once | ORAL | 0 refills | Status: AC

## 2023-06-21 NOTE — Telephone Encounter (Signed)
 Per protocol, pt requires tx with Diflucan.  Rx sent to pharmacy on file.

## 2023-07-22 ENCOUNTER — Ambulatory Visit (INDEPENDENT_AMBULATORY_CARE_PROVIDER_SITE_OTHER): Admitting: Family Medicine

## 2023-07-22 VITALS — BP 112/64 | HR 74 | Temp 97.8°F | Ht 62.0 in | Wt 149.0 lb

## 2023-07-22 DIAGNOSIS — N898 Other specified noninflammatory disorders of vagina: Secondary | ICD-10-CM | POA: Diagnosis not present

## 2023-07-22 DIAGNOSIS — Z3201 Encounter for pregnancy test, result positive: Secondary | ICD-10-CM

## 2023-07-22 DIAGNOSIS — R110 Nausea: Secondary | ICD-10-CM | POA: Diagnosis not present

## 2023-07-22 LAB — POCT URINE PREGNANCY: Preg Test, Ur: POSITIVE — AB

## 2023-07-22 MED ORDER — NYSTATIN 100000 UNIT/GM EX OINT: 1.0000 | TOPICAL_OINTMENT | Freq: Two times a day (BID) | CUTANEOUS | 0 refills | Status: DC

## 2023-07-22 NOTE — Patient Instructions (Addendum)
 Your pregnancy is positive.  Do not take the Diflucan pill for itching.  I prescribed you an antifungal to use topically.  If it is expensive at the pharmacy then you can get over-the-counter Monistat.  Cream or clotrimazole cream.  Topical treatment is safer.   Please follow-up with your OB/GYN.  Stay well-hydrated.    Commonly Asked Questions During Pregnancy  Cats: A parasite can be excreted in cat feces.  To avoid exposure you need to have another person empty the little box.  If you must empty the litter box you will need to wear gloves.  Wash your hands after handling your cat.  This parasite can also be found in raw or undercooked meat so this should also be avoided.  Colds, Sore Throats, Flu: Please check your medication sheet to see what you can take for symptoms.  If your symptoms are unrelieved by these medications please call the office.  Dental Work: Most any dental work Agricultural consultant recommends is permitted.  X-rays should only be taken during the first trimester if absolutely necessary.  Your abdomen should be shielded with a lead apron during all x-rays.  Please notify your provider prior to receiving any x-rays.  Novocaine is fine; gas is not recommended.  If your dentist requires a note from [place] prior to dental work please call the office and we will provide one for you.  Exercise: Exercise is an important part of staying healthy during your pregnancy.  You may continue most exercises you were accustomed to prior to pregnancy.  Later in your pregnancy you will most likely notice you have difficulty with activities requiring balance like riding a bicycle.  It is important that you listen to your body and avoid activities that put you at a higher risk of falling.  Adequate rest and staying well hydrated are a must!  If you have questions about the safety of specific activities ask your provider.    Exposure to Children with illness: Try to avoid obvious exposure; report any  symptoms to [place] when noted,  If you have chicken pos, red measles or mumps, you should be immune to these diseases.   Please do not take any vaccines while pregnant unless you have checked with your OB provider.  Fetal Movement: After 28 weeks we recommend you do "kick counts" twice daily.  Lie or sit down in a calm quiet environment and count your baby movements "kicks".  You should feel your baby at least 10 times per hour.  If you have not felt 10 kicks within the first hour get up, walk around and have something sweet to eat or drink then repeat for an additional hour.  If count remains less than 10 per hour notify your provider.  Fumigating: Follow your pest control agent's advice as to how long to stay out of your home.  Ventilate the area well before re-entering.  Hemorrhoids:   Most over-the-counter preparations can be used during pregnancy.  Check your medication to see what is safe to use.  It is important to use a stool softener or fiber in your diet and to drink lots of liquids.  If hemorrhoids seem to be getting worse please call the office.   Hot Tubs:  Hot tubs Jacuzzis and saunas are not recommended while pregnant.  These increase your internal body temperature and should be avoided.  Intercourse:  Sexual intercourse is safe during pregnancy as long as you are comfortable, unless otherwise advised by your provider.  Spotting may occur after intercourse; report any bright red bleeding that is heavier than spotting.  Labor:  If you know that you are in labor, please go to the hospital.  If you are unsure, please call the office and let us  help you decide what to do.  Lifting, straining, etc:  If your job requires heavy lifting or straining please check with your provider for any limitations.  Generally, you should not lift items heavier than that you can lift simply with your hands and arms (no back muscles)  Painting:  Paint fumes do not harm your pregnancy, but may make you ill and  should be avoided if possible.  Latex or water based paints have less odor than oils.  Use adequate ventilation while painting.  Permanents & Hair Color:  Chemicals in hair dyes are not recommended as they cause increase hair dryness which can increase hair loss during pregnancy.  " Highlighting" and permanents are allowed.  Dye may be absorbed differently and permanents may not hold as well during pregnancy.  Sunbathing:  Use a sunscreen, as skin burns easily during pregnancy.  Drink plenty of fluids; avoid over heating.  Tanning Beds:  Because their possible side effects are still unknown, tanning beds are not recommended.  Ultrasound Scans:  Routine ultrasounds are performed at approximately 20 weeks.  You will be able to see your baby's general anatomy an if you would like to know the gender this can usually be determined as well.  If it is questionable when you conceived you may also receive an ultrasound early in your pregnancy for dating purposes.  Otherwise ultrasound exams are not routinely performed unless there is a medical necessity.  Although you can request a scan we ask that you pay for it when conducted because insurance does not cover " patient request" scans.  Work: If your pregnancy proceeds without complications you may work until your due date, unless your physician or employer advises otherwise.  Round Ligament Pain/Pelvic Discomfort:  Sharp, shooting pains not associated with bleeding are fairly common, usually occurring in the second trimester of pregnancy.  They tend to be worse when standing up or when you remain standing for long periods of time.  These are the result of pressure of certain pelvic ligaments called "round ligaments".  Rest, Tylenol and heat seem to be the most effective relief.  As the womb and fetus grow, they rise out of the pelvis and the discomfort improves.  Please notify the office if your pain seems different than that described.  It may represent a more  serious condition.

## 2023-07-22 NOTE — Progress Notes (Signed)
 Subjective:     Patient ID: Rich Number, female    DOB: 06-28-2000, 23 y.o.   MRN: [id number]  Chief Complaint  Patient presents with   Possible Pregnancy    Took 4 tests on the 8th, all positive last period march 8. Wants extra confirmation before she books with obyn  Thinks she may still have yeast infection, recently given diflucan. Given 2 sets of pills but didn't take 2nd set as unsure how it will effect pregnancy    Possible Pregnancy Associated symptoms include nausea. Pertinent negatives include no abdominal pain, chest pain, chills, fever or vomiting.     History of Present Illness         Here requesting pregnancy test. States she took home pregnancy tests which were positive but wants to confirm.   C/o mild nausea.   LMP: June 15 2023   She goes to [facility] gynecology.    She saw urgent care 4 wks ago and was treated for BV and yeast.   She is still having vaginal itching, no discharge.   Denies urinary symptoms.   She has living 1 child. 1 abortion in 2023.      Health Maintenance Due  Topic Date Due   HPV VACCINES (1 - 3-dose series) Never done    Past Medical History:  Diagnosis Date   Pregnant     Past Surgical History:  Procedure Laterality Date   NO PAST SURGERIES      Family History  Problem Relation Age of Onset   Healthy Mother    Healthy Father    Cancer Neg Hx    Hypertension Neg Hx    Eclampsia Neg Hx    Diabetes Neg Hx     Social History   Socioeconomic History   Marital status: Single    Spouse name: Not on file   Number of children: Not on file   Years of education: Not on file   Highest education level: 12th grade  Occupational History   Not on file  Tobacco Use   Smoking status: Former    Current packs/day: 0.00    Types: Cigarettes    Quit date: 2023    Years since quitting: 2.2   Smokeless tobacco: Never  Vaping Use   Vaping status: Never Used  Substance and Sexual Activity   Alcohol use: No    Drug use: Yes    Frequency: 4.0 times per week    Types: Marijuana   Sexual activity: Yes    Birth control/protection: None  Other Topics Concern   Not on file  Social History Narrative   ** Merged History Encounter **       Social Drivers of Health   Financial Resource Strain: Low Risk  (07/22/2023)   Overall Financial Resource Strain (CARDIA)    Difficulty of Paying Living Expenses: Not very hard  Food Insecurity: Unknown (07/22/2023)   Hunger Vital Sign    Worried About Running Out of Food in the Last Year: Never true    Ran Out of Food in the Last Year: Patient declined  Transportation Needs: No Transportation Needs (07/22/2023)   PRAPARE - Administrator, Civil Service (Medical): No    Lack of Transportation (Non-Medical): No  Physical Activity: Insufficiently Active (07/22/2023)   Exercise Vital Sign    Days of Exercise per Week: 5 days    Minutes of Exercise per Session: 10 min  Stress: No Stress Concern Present (07/22/2023)   [place]  [employer] - Occupational Stress Questionnaire    Feeling of Stress : Only a little  Social Connections: Unknown (07/22/2023)   Social Connection and Isolation Panel [NHANES]    Frequency of Communication with Friends and Family: Twice a week    Frequency of Social Gatherings with Friends and Family: Once a week    Attends Religious Services: Never    Database administrator or Organizations: No    Attends Engineer, structural: Not on file    Marital Status: Patient declined  Intimate Partner Violence: Unknown (07/13/2021)   Received from [employer], [facility]   HITS    Physically Hurt: Not on file    Insult or Talk Down To: Not on file    Threaten Physical Harm: Not on file    Scream or Curse: Not on file    No outpatient medications prior to visit.   No facility-administered medications prior to visit.    No Known Allergies  Review of Systems  Constitutional:  Negative for chills  and fever.  Respiratory:  Negative for shortness of breath.   Cardiovascular:  Negative for chest pain and palpitations.  Gastrointestinal:  Positive for nausea. Negative for abdominal pain, constipation, diarrhea and vomiting.  Genitourinary:  Negative for dysuria, frequency, hematuria and urgency.  Neurological:  Negative for dizziness and focal weakness.  Psychiatric/Behavioral:  Negative for depression. The patient is not nervous/anxious.        Objective:    Physical Exam Constitutional:      General: She is not in acute distress.    Appearance: She is not ill-appearing.  HENT:     Mouth/Throat:     Mouth: Mucous membranes are moist.  Eyes:     Extraocular Movements: Extraocular movements intact.     Conjunctiva/sclera: Conjunctivae normal.  Cardiovascular:     Rate and Rhythm: Normal rate and regular rhythm.  Pulmonary:     Effort: Pulmonary effort is normal.     Breath sounds: Normal breath sounds.  Musculoskeletal:     Cervical back: Normal range of motion and neck supple. No tenderness.  Lymphadenopathy:     Cervical: No cervical adenopathy.  Skin:    General: Skin is warm and dry.  Neurological:     General: No focal deficit present.     Mental Status: She is alert and oriented to person, place, and time.     Motor: No weakness.     Coordination: Coordination normal.     Gait: Gait normal.  Psychiatric:        Mood and Affect: Mood normal.        Behavior: Behavior normal.        Thought Content: Thought content normal.      BP 112/64 (BP Location: Left Arm, Patient Position: Sitting)   Pulse 74   Temp 97.8 F (36.6 C) (Temporal)   Ht 5\' 2"  (1.575 m)   Wt 149 lb (67.6 kg)   LMP 06/15/2023   SpO2 99%   BMI 27.25 kg/m  Wt Readings from Last 3 Encounters:  07/22/23 149 lb (67.6 kg)  06/20/23 165 lb (74.8 kg)  01/14/23 160 lb 9.6 oz (72.8 kg)       Assessment & Plan:   Problem List Items Addressed This Visit   None Visit Diagnoses        Positive urine pregnancy test    -  Primary   Relevant Orders   POCT urine pregnancy (Completed)  Nausea         Vagina itching         Urine pregnancy test positive. Reviewed urgent care note 1 month ago when she was treated for BV and yeast.  Advised her to avoid oral Diflucan now that she is pregnant.  Topical nystatin prescribed. Discussed avoiding any harmful substances or medications.  She will call and schedule with her OB/GYN.   I am having Lacye O. Dillon start on nystatin ointment.  Meds ordered this encounter  Medications   nystatin ointment (MYCOSTATIN)    Sig: Apply 1 Application topically 2 (two) times daily.    Dispense:  30 g    Refill:  0    Supervising Provider:   Bambi Lever A [[id number]]

## 2023-07-23 ENCOUNTER — Ambulatory Visit

## 2023-07-23 ENCOUNTER — Other Ambulatory Visit (HOSPITAL_COMMUNITY)
Admission: RE | Admit: 2023-07-23 | Discharge: 2023-07-23 | Disposition: A | Discharge status: Home / Self Care | Source: Ambulatory Visit | Attending: Obstetrics and Gynecology | Admitting: Obstetrics and Gynecology

## 2023-07-23 VITALS — BP 111/74 | HR 72

## 2023-07-23 DIAGNOSIS — N898 Other specified noninflammatory disorders of vagina: Secondary | ICD-10-CM | POA: Insufficient documentation

## 2023-07-23 MED ORDER — TERCONAZOLE 0.8 % VA CREA: 1.0000 | TOPICAL_CREAM | Freq: Every day | VAGINAL | 0 refills | Status: DC

## 2023-07-23 NOTE — Progress Notes (Signed)
 SUBJECTIVE:  23 y.o. female complains of vaginal itching for 3 days. Was seen by PCP yesterday who confirmed pregnancy and yeast infection. Pt was prescribed nystatin topical ointment, but informed pt about terconazole applicator X3 nights per protocol here at our office, and pt would prefer to do this instead. Rx sent since pt was confirmed to have yeast infection yesterday.   Denies abnormal vaginal bleeding or significant pelvic pain or fever. No UTI symptoms. Denies history of known exposure to STD.  Patient's last menstrual period was 06/15/2023.  OBJECTIVE:  She appears well, afebrile. Urine dipstick: not done.  ASSESSMENT:  Vaginal Discharge  Vaginal Odor   PLAN:  GC, chlamydia, trichomonas, BVAG, CVAG probe sent to lab. Treatment: To be determined once lab results are received ROV prn if symptoms persist or worsen.

## 2023-07-25 LAB — CERVICOVAGINAL ANCILLARY ONLY
Bacterial Vaginitis (gardnerella): POSITIVE — AB
Candida Glabrata: NEGATIVE
Candida Vaginitis: NEGATIVE
Chlamydia: NEGATIVE
Comment: NEGATIVE
Comment: NEGATIVE
Comment: NEGATIVE
Comment: NEGATIVE
Comment: NEGATIVE
Comment: NORMAL
Neisseria Gonorrhea: NEGATIVE
Trichomonas: NEGATIVE

## 2023-07-26 ENCOUNTER — Encounter: Payer: Self-pay | Admitting: Obstetrics and Gynecology

## 2023-07-26 MED ORDER — METRONIDAZOLE 0.75 % VA GEL: 1.0000 | Freq: Every day | VAGINAL | 1 refills | Status: DC

## 2023-07-26 NOTE — Addendum Note (Signed)
 Addended by: Loralyn Rochester on: 07/26/2023 10:00 AM   Modules accepted: Orders

## 2023-08-14 ENCOUNTER — Encounter: Payer: Self-pay | Admitting: *Deleted

## 2023-08-14 ENCOUNTER — Other Ambulatory Visit: Payer: Self-pay | Admitting: *Deleted

## 2023-08-15 ENCOUNTER — Other Ambulatory Visit (INDEPENDENT_AMBULATORY_CARE_PROVIDER_SITE_OTHER): Payer: Self-pay

## 2023-08-15 ENCOUNTER — Ambulatory Visit: Admitting: *Deleted

## 2023-08-15 VITALS — BP 110/72 | HR 63 | Wt 155.3 lb

## 2023-08-15 DIAGNOSIS — O3680X0 Pregnancy with inconclusive fetal viability, not applicable or unspecified: Secondary | ICD-10-CM | POA: Diagnosis not present

## 2023-08-15 DIAGNOSIS — Z3480 Encounter for supervision of other normal pregnancy, unspecified trimester: Secondary | ICD-10-CM | POA: Insufficient documentation

## 2023-08-15 DIAGNOSIS — Z3A01 Less than 8 weeks gestation of pregnancy: Secondary | ICD-10-CM | POA: Diagnosis not present

## 2023-08-15 MED ORDER — VITAFOL GUMMIES 3.33-0.333-34.8 MG PO CHEW: 3.0000 | CHEWABLE_TABLET | Freq: Every day | ORAL | 5 refills | Status: DC

## 2023-08-15 MED ORDER — BLOOD PRESSURE KIT DEVI: 1.0000 | 0 refills | Status: DC

## 2023-08-15 NOTE — Progress Notes (Signed)
 GS only seen on US  08/15/23. Repeat US  in 2 weeks.
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
## 2023-08-29 ENCOUNTER — Telehealth: Payer: Self-pay

## 2023-08-29 ENCOUNTER — Other Ambulatory Visit: Payer: Self-pay | Admitting: Obstetrics and Gynecology

## 2023-08-29 ENCOUNTER — Encounter (HOSPITAL_COMMUNITY): Payer: Self-pay | Admitting: Obstetrics & Gynecology

## 2023-08-29 ENCOUNTER — Other Ambulatory Visit (INDEPENDENT_AMBULATORY_CARE_PROVIDER_SITE_OTHER): Payer: Self-pay

## 2023-08-29 ENCOUNTER — Inpatient Hospital Stay (HOSPITAL_COMMUNITY)

## 2023-08-29 ENCOUNTER — Inpatient Hospital Stay (HOSPITAL_COMMUNITY)
Admission: AD | Admit: 2023-08-29 | Discharge: 2023-08-29 | Disposition: A | Discharge status: Home / Self Care | Attending: Obstetrics and Gynecology | Admitting: Obstetrics and Gynecology

## 2023-08-29 ENCOUNTER — Ambulatory Visit: Admitting: *Deleted

## 2023-08-29 ENCOUNTER — Other Ambulatory Visit: Payer: Self-pay

## 2023-08-29 VITALS — BP 116/83 | HR 89 | Wt 160.2 lb

## 2023-08-29 DIAGNOSIS — Z3A01 Less than 8 weeks gestation of pregnancy: Secondary | ICD-10-CM

## 2023-08-29 DIAGNOSIS — O020 Blighted ovum and nonhydatidiform mole: Secondary | ICD-10-CM | POA: Diagnosis present

## 2023-08-29 DIAGNOSIS — O3680X0 Pregnancy with inconclusive fetal viability, not applicable or unspecified: Secondary | ICD-10-CM

## 2023-08-29 DIAGNOSIS — Z3A10 10 weeks gestation of pregnancy: Secondary | ICD-10-CM | POA: Diagnosis not present

## 2023-08-29 LAB — HCG, QUANTITATIVE, PREGNANCY: hCG, Beta Chain, Quant, S: >250000 m[IU]/mL — ABNORMAL HIGH (ref <5)

## 2023-08-29 NOTE — Progress Notes (Signed)
 Spoke w/ via phone for pre-op interview--- [facility] needs dos----   none per anesthesia, surgeon orders requested 08/29/23.      Lab results------ COVID test -----patient states asymptomatic no test needed Arrive at -------1140 NPO after MN NO Solid Food.  Clear liquids from MN until---1040 Pre-Surgery Ensure or G2:  Med rec completed Medications to take morning of surgery -----NONE Diabetic medication -----  GLP1 agonist last dose: GLP1 instructions:  Patient instructed no nail polish to be worn day of surgery Patient instructed to bring photo id and insurance card day of surgery Patient aware to have Driver (ride ) / caregiver    for 24 hours after surgery - Mother [employer] Patient Special Instructions ----- Shower with antibacterial soap. Pre-Op special Instructions -----  Patient verbalized understanding of instructions that were given at this phone interview. Patient denies chest pain, sob, fever, cough at the interview.

## 2023-08-29 NOTE — Telephone Encounter (Signed)
 I called patient to provide surgery details for 08/30/23 at [facility] at 1:15 pm. Patient confirmed understanding and will arrive by 11:15 am. I provided surgery details and pre-op instructions over the phone.

## 2023-08-29 NOTE — Progress Notes (Signed)
 Bianca Webb presents for repeat US  to confirm viability and dating. Today's scan is concerning for possible molar pregnancy. Dr. Alto Atta was consulted and reviewed images and spoke with pt regarding preliminary findings. Advised pt to seek care in MAU as soon as possible, so that a formal US  and follow up care can be expedited. Pt verbalized understanding and intention to seek care in MAU. She has her daughter with her and will secure care with a grandparent.

## 2023-08-29 NOTE — MAU Note (Signed)
 Bianca Webb is a 23 y.o. at [redacted]w[redacted]d here in MAU reporting: she was sent from [facility] office for better imaging and blood work.  May need surgical procedure.  Denies pain or VB.  LMP: 06/15/2023 Onset of complaint: today Pain score: 0 Vitals:   08/29/23 1047  BP: 111/63  Pulse: 76  Resp: 20  Temp: 98.1 F (36.7 C)  SpO2: 100%     FHT: NA  Lab orders placed from triage: None

## 2023-08-29 NOTE — MAU Provider Note (Signed)
 Bianca Webb is a 23 y.o. [phone] patient who presents to MAU today sent from [facility] office for official ultrasound d/t concern for Molar pregnancy. Patient denies any VB, LOF, and reports no pain  at this time.   O BP 111/63 (BP Location: Right Arm)   Pulse 76   Temp 98.1 F (36.7 C) (Oral)   Resp 20   Ht 5\' 2"  (1.575 m)   Wt 69.9 kg   LMP 06/15/2023 (Exact Date)   SpO2 100%   BMI 28.17 kg/m  Physical Exam Vitals and nursing note reviewed.  Constitutional:      Appearance: Normal appearance.  HENT:     Head: Normocephalic.     Nose: Nose normal.     Mouth/Throat:     Mouth: Mucous membranes are moist.  Cardiovascular:     Rate and Rhythm: Normal rate.  Pulmonary:     Effort: Pulmonary effort is normal.  Abdominal:     Palpations: Abdomen is soft.  Musculoskeletal:     Cervical back: Normal range of motion.  Skin:    General: Skin is warm.  Neurological:     Mental Status: She is alert and oriented to person, place, and time.  Psychiatric:        Mood and Affect: Mood normal.        Behavior: Behavior normal.   Pelvic deferred in lie of Ultrasound    MDM  HIGH  Suspicion for Molar Pregnancy  - OB Ultrasound : C/W Molar Pregnancy - Hcg Quant : >250,000   Case d/w Dr Dodie Frees ( OB Attending) Patient will be scheduled for DC due to confirmed Molar Pregnancy. Surgical scheduling request sent by Dr Dodie Frees   Orders Placed This Encounter  Procedures   US  OB LESS THAN 14 WEEKS WITH OB TRANSVAGINAL    Standing Status:   Standing    Number of Occurrences:   1    Symptom/Reason for Exam:   Abnormal fetal ultrasound [[id number]]    Release to patient:   Immediate   hCG, quantitative, pregnancy    Standing Status:   Standing    Number of Occurrences:   1   Discharge patient Discharge disposition: 01-Home or Self Care; Discharge patient date: 08/29/2023    Standing Status:   Standing    Number of Occurrences:   1    Discharge disposition:   01-Home or  Self Care [1]    Discharge patient date:   08/29/2023      Results for orders placed or performed during the hospital encounter of 08/29/23 (from the past 24 hours)  hCG, quantitative, pregnancy     Status: Abnormal   Collection Time: 08/29/23 11:20 AM  Result Value Ref Range   hCG, Beta Chain, Quant, Bianca >250,000 (H) <5 mIU/mL     I have reviewed the patient chart and performed the physical exam . I have ordered & interpreted the lab results and reviewed and interpreted the ultrasound images with Dr Dodie Frees ( OB Attending)  A/P as described below.  Counseling and education provided and patient agreeable  with plan as described below. Verbalized understanding.    ASSESSMENT Medical screening exam complete  1. Molar pregnancy (Primary)  2. [redacted] weeks gestation of pregnancy    PLAN  Discharge from MAU in stable condition  Will be scheduled for DC due to Molar Pregnancy - Request sent for scheduling by Dr Dodie Frees   Strict precautions reviewed  with patient   See AVS  for full description of educational information and instructions provided to the patient at time of discharge   Warning signs for worsening condition that would warrant emergency follow-up discussed Patient may return to MAU as needed   Cherlynn Cornfield, NP 08/29/2023 1:30 PM

## 2023-08-30 ENCOUNTER — Other Ambulatory Visit: Payer: Self-pay

## 2023-08-30 ENCOUNTER — Encounter (HOSPITAL_COMMUNITY)
Admission: RE | Disposition: A | Discharge status: Home / Self Care | Payer: Self-pay | Source: Home / Self Care | Attending: Obstetrics & Gynecology

## 2023-08-30 ENCOUNTER — Ambulatory Visit (HOSPITAL_COMMUNITY)
Admission: RE | Admit: 2023-08-30 | Discharge: 2023-08-30 | Disposition: A | Discharge status: Home / Self Care | Attending: Obstetrics & Gynecology | Admitting: Obstetrics & Gynecology

## 2023-08-30 ENCOUNTER — Ambulatory Visit (HOSPITAL_COMMUNITY)

## 2023-08-30 ENCOUNTER — Encounter: Payer: Self-pay | Admitting: Obstetrics & Gynecology

## 2023-08-30 ENCOUNTER — Encounter (HOSPITAL_COMMUNITY): Payer: Self-pay | Admitting: Obstetrics & Gynecology

## 2023-08-30 ENCOUNTER — Other Ambulatory Visit (HOSPITAL_COMMUNITY): Payer: Self-pay

## 2023-08-30 ENCOUNTER — Ambulatory Visit

## 2023-08-30 DIAGNOSIS — Z3A10 10 weeks gestation of pregnancy: Secondary | ICD-10-CM | POA: Diagnosis not present

## 2023-08-30 DIAGNOSIS — Z3A00 Weeks of gestation of pregnancy not specified: Secondary | ICD-10-CM | POA: Diagnosis not present

## 2023-08-30 DIAGNOSIS — O020 Blighted ovum and nonhydatidiform mole: Secondary | ICD-10-CM

## 2023-08-30 DIAGNOSIS — R9389 Abnormal findings on diagnostic imaging of other specified body structures: Secondary | ICD-10-CM

## 2023-08-30 DIAGNOSIS — O283 Abnormal ultrasonic finding on antenatal screening of mother: Secondary | ICD-10-CM

## 2023-08-30 HISTORY — PX: DILATION AND EVACUATION: SHX1459

## 2023-08-30 HISTORY — PX: ULTRASOUND GUIDANCE: SHX7651

## 2023-08-30 LAB — CBC
HCT: 41.7 % (ref 36.0–46.0)
Hemoglobin: 14.3 g/dL (ref 12.0–15.0)
MCH: 30.5 pg (ref 26.0–34.0)
MCHC: 34.3 g/dL (ref 30.0–36.0)
MCV: 88.9 fL (ref 80.0–100.0)
Platelets: 217 10*3/uL (ref 150–400)
RBC: 4.69 MIL/uL (ref 3.87–5.11)
RDW: 13.3 % (ref 11.5–15.5)
WBC: 10 10*3/uL (ref 4.0–10.5)
nRBC: 0 % (ref 0.0–0.2)

## 2023-08-30 LAB — TYPE AND SCREEN
ABO/RH(D): O POS
Antibody Screen: NEGATIVE

## 2023-08-30 SURGERY — DILATION AND EVACUATION, UTERUS
Anesthesia: General | Site: Uterus

## 2023-08-30 MED ORDER — MIDAZOLAM HCL 2 MG/2ML IJ SOLN
INTRAMUSCULAR | Status: DC | PRN
  Administered 2023-08-30: 2 mg via INTRAVENOUS

## 2023-08-30 MED ORDER — ONDANSETRON HCL 4 MG/2ML IJ SOLN
INTRAMUSCULAR | Status: AC
  Filled 2023-08-30: qty 2

## 2023-08-30 MED ORDER — ONDANSETRON HCL 4 MG/2ML IJ SOLN
INTRAMUSCULAR | Status: DC | PRN
  Administered 2023-08-30: 4 mg via INTRAVENOUS

## 2023-08-30 MED ORDER — DEXAMETHASONE SODIUM PHOSPHATE 10 MG/ML IJ SOLN
INTRAMUSCULAR | Status: DC | PRN
  Administered 2023-08-30: 10 mg via INTRAVENOUS

## 2023-08-30 MED ORDER — ORAL CARE MOUTH RINSE: 15.0000 mL | Freq: Once | OROMUCOSAL | Status: AC

## 2023-08-30 MED ORDER — CHLORHEXIDINE GLUCONATE 0.12 % MT SOLN
OROMUCOSAL | Status: AC
  Filled 2023-08-30: qty 15

## 2023-08-30 MED ORDER — LIDOCAINE 2% (20 MG/ML) 5 ML SYRINGE
INTRAMUSCULAR | Status: DC | PRN
  Administered 2023-08-30: 60 mg via INTRAVENOUS

## 2023-08-30 MED ORDER — DEXAMETHASONE SODIUM PHOSPHATE 10 MG/ML IJ SOLN
INTRAMUSCULAR | Status: AC
  Filled 2023-08-30: qty 1

## 2023-08-30 MED ORDER — 0.9 % SODIUM CHLORIDE (POUR BTL) OPTIME
TOPICAL | Status: DC | PRN
  Administered 2023-08-30: 1000 mL

## 2023-08-30 MED ORDER — POVIDONE-IODINE 10 % EX SWAB
2.0000 | Freq: Once | CUTANEOUS | Status: DC
Start: 2023-08-30 — End: 2023-08-30

## 2023-08-30 MED ORDER — CHLORHEXIDINE GLUCONATE 0.12 % MT SOLN
15.0000 mL | Freq: Once | OROMUCOSAL | Status: AC
  Administered 2023-08-30: 15 mL via OROMUCOSAL

## 2023-08-30 MED ORDER — FENTANYL CITRATE (PF) 250 MCG/5ML IJ SOLN
INTRAMUSCULAR | Status: AC
  Filled 2023-08-30: qty 5

## 2023-08-30 MED ORDER — MIDAZOLAM HCL 2 MG/2ML IJ SOLN
INTRAMUSCULAR | Status: AC
  Filled 2023-08-30: qty 2

## 2023-08-30 MED ORDER — LACTATED RINGERS IV SOLN: INTRAVENOUS | Status: DC

## 2023-08-30 MED ORDER — BUPIVACAINE HCL 0.5 % IJ SOLN
INTRAMUSCULAR | Status: DC | PRN
  Administered 2023-08-30: 30 mL

## 2023-08-30 MED ORDER — PROPOFOL 10 MG/ML IV BOLUS
INTRAVENOUS | Status: DC | PRN
  Administered 2023-08-30: 200 mg via INTRAVENOUS
  Administered 2023-08-30: 125 ug/kg/min via INTRAVENOUS

## 2023-08-30 MED ORDER — OXYCODONE HCL 5 MG PO TABS
5.0000 mg | ORAL_TABLET | ORAL | 0 refills | Status: DC | PRN
  Filled 2023-08-30: qty 15, 3d supply, fill #0

## 2023-08-30 MED ORDER — LIDOCAINE 2% (20 MG/ML) 5 ML SYRINGE
INTRAMUSCULAR | Status: AC
  Filled 2023-08-30: qty 5

## 2023-08-30 MED ORDER — ACETAMINOPHEN 500 MG PO TABS
1000.0000 mg | ORAL_TABLET | Freq: Four times a day (QID) | ORAL | 0 refills | Status: AC | PRN
  Filled 2023-08-30: qty 100, 13d supply, fill #0

## 2023-08-30 MED ORDER — FENTANYL CITRATE (PF) 100 MCG/2ML IJ SOLN: 25.0000 ug | INTRAMUSCULAR | Status: DC | PRN

## 2023-08-30 MED ORDER — FENTANYL CITRATE (PF) 250 MCG/5ML IJ SOLN
INTRAMUSCULAR | Status: DC | PRN
Start: 2023-08-30 — End: 2023-08-30
  Administered 2023-08-30 (×2): 50 ug via INTRAVENOUS

## 2023-08-30 MED ORDER — DOXYCYCLINE HYCLATE 100 MG IV SOLR
200.0000 mg | INTRAVENOUS | Status: AC
  Administered 2023-08-30: 200 mg via INTRAVENOUS
  Filled 2023-08-30: qty 200

## 2023-08-30 MED ORDER — KETOROLAC TROMETHAMINE 30 MG/ML IJ SOLN
INTRAMUSCULAR | Status: DC | PRN
  Administered 2023-08-30: 30 mg via INTRAVENOUS

## 2023-08-30 MED ORDER — IBUPROFEN 800 MG PO TABS: 800.0000 mg | ORAL_TABLET | Freq: Three times a day (TID) | ORAL | Status: DC | PRN

## 2023-08-30 MED ORDER — DOCUSATE SODIUM 100 MG PO CAPS
100.0000 mg | ORAL_CAPSULE | Freq: Two times a day (BID) | ORAL | 2 refills | Status: DC | PRN
  Filled 2023-08-30: qty 30, 15d supply, fill #0

## 2023-08-30 MED ORDER — ACETAMINOPHEN 10 MG/ML IV SOLN
INTRAVENOUS | Status: DC | PRN
  Administered 2023-08-30: 1000 mg via INTRAVENOUS

## 2023-08-30 SURGICAL SUPPLY — 21 items
CATH ROBINSON RED A/P 16FR (CATHETERS) ×2 IMPLANT
COVER MAYO STAND STRL (DRAPES) ×2 IMPLANT
FILTER UTR ASPR ASSEMBLY (MISCELLANEOUS) ×2 IMPLANT
GLOVE BIOGEL PI IND STRL 7.0 (GLOVE) ×2 IMPLANT
GLOVE ECLIPSE 7.0 STRL STRAW (GLOVE) ×2 IMPLANT
GOWN STRL REUS W/ TWL LRG LVL3 (GOWN DISPOSABLE) ×4 IMPLANT
HOSE CONNECTING 18IN BERKELEY (TUBING) ×2 IMPLANT
KIT BERKELEY 1ST TRI 3/8 NO TR (MISCELLANEOUS) ×2 IMPLANT
KIT BERKELEY 1ST TRIMESTER 3/8 (MISCELLANEOUS) ×2 IMPLANT
NS IRRIG 1000ML POUR BTL (IV SOLUTION) ×2 IMPLANT
PACK VAGINAL MINOR WOMEN LF (CUSTOM PROCEDURE TRAY) ×2 IMPLANT
PAD OB MATERNITY 11 LF (PERSONAL CARE ITEMS) ×2 IMPLANT
SET BERKELEY SUCTION TUBING (SUCTIONS) ×2 IMPLANT
SPIKE FLUID TRANSFER (MISCELLANEOUS) ×2 IMPLANT
TOWEL GREEN STERILE FF (TOWEL DISPOSABLE) ×4 IMPLANT
UNDERPAD 30X36 HEAVY ABSORB (UNDERPADS AND DIAPERS) ×2 IMPLANT
VACURETTE 10 RIGID CVD (CANNULA) IMPLANT
VACURETTE 6 ASPIR F TIP BERK (CANNULA) IMPLANT
VACURETTE 7MM CVD STRL WRAP (CANNULA) IMPLANT
VACURETTE 8 RIGID CVD (CANNULA) IMPLANT
VACURETTE 9 RIGID CVD (CANNULA) IMPLANT

## 2023-08-30 NOTE — Anesthesia Procedure Notes (Signed)
 Procedure Name: LMA Insertion Date/Time: 08/30/2023 2:00 PM  Performed by: Chitara Clonch C, CRNAPre-anesthesia Checklist: Patient identified, Emergency Drugs available, Suction available and Patient being monitored Patient Re-evaluated:Patient Re-evaluated prior to induction Oxygen Delivery Method: Circle System Utilized Preoxygenation: Pre-oxygenation with 100% oxygen Induction Type: IV induction Ventilation: Mask ventilation without difficulty LMA: LMA inserted LMA Size: 4.0 Number of attempts: 1 Airway Equipment and Method: Bite block Placement Confirmation: positive ETCO2 Tube secured with: Tape Dental Injury: Teeth and Oropharynx as per pre-operative assessment

## 2023-08-30 NOTE — H&P (Signed)
 Preoperative History and Physical  Bianca Webb is a 23 y.o. G3P1011 here for surgical management of suspected molar pregnancy.   No significant preoperative concerns.  Proposed surgery: Dilation and Evacuation under ultrasound guidance  Past Medical History:  Diagnosis Date   Medical history non-contributory    Past Surgical History:  Procedure Laterality Date   NO PAST SURGERIES     OB History  Gravida Para Term Preterm AB Living  3 1 1  0 1 1  SAB IAB Ectopic Multiple Live Births  0 1 0 0 1    # Outcome Date GA Lbr Len/2nd Weight Sex Type Anes PTL Lv  3 Current           2 IAB 04/2021     TAB     1 Term 12/24/18 [redacted]w[redacted]d 20:06 / 00:20 3490 g F Vag-Spont EPI  LIV  Patient denies any other pertinent gynecologic issues.   No current facility-administered medications on file prior to encounter.   Current Outpatient Medications on File Prior to Encounter  Medication Sig Dispense Refill   Blood Pressure Monitoring (BLOOD PRESSURE KIT) DEVI 1 Device by Does not apply route once a week. 1 each 0   [DISCONTINUED] ferrous sulfate  325 (65 FE) MG tablet Take 1 tablet (325 mg total) by mouth daily with breakfast. (Patient not taking: Reported on 01/29/2019) 90 tablet 1   No Known Allergies  Social History:   reports that she has never smoked. She has never used smokeless tobacco. She reports that she does not currently use drugs after having used the following drugs: Marijuana. Frequency: 4.00 times per week. She reports that she does not drink alcohol.  Family History  Problem Relation Age of Onset   Healthy Mother    Healthy Father    Cancer Neg Hx    Hypertension Neg Hx    Eclampsia Neg Hx    Diabetes Neg Hx     Review of Systems: Pertinent items noted in HPI and remainder of comprehensive ROS otherwise negative.  PHYSICAL EXAM: Blood pressure 114/79, pulse 88, temperature 98.9 F (37.2 C), temperature source Oral, resp. rate 16, height 5\' 2"  (1.575 m), weight  69.9 kg, last menstrual period 06/15/2023, SpO2 97%. CONSTITUTIONAL: Well-developed, well-nourished female in no acute distress.  HENT:  Normocephalic, atraumatic, External right and left ear normal. Oropharynx is clear and moist EYES: Conjunctivae and EOM are normal. Pupils are equal, round, and reactive to light. No scleral icterus.  NECK: Normal range of motion, supple, no masses SKIN: Skin is warm and dry. No rash noted. Not diaphoretic. No erythema. No pallor. NEUROLOGIC: Alert and oriented to person, place, and time. Normal reflexes, muscle tone coordination. No cranial nerve deficit noted. PSYCHIATRIC: Normal mood and affect. Normal behavior. Normal judgment and thought content. CARDIOVASCULAR: Normal heart rate noted, regular rhythm RESPIRATORY: Effort and breath sounds normal, no problems with respiration noted ABDOMEN: Soft, nontender, nondistended. PELVIC: Deferred MUSCULOSKELETAL: Normal range of motion. No edema and no tenderness. 2+ distal pulses.  Labs: Results for orders placed or performed during the hospital encounter of 08/30/23 (from the past 2 weeks)  CBC   Collection Time: 08/30/23 11:30 AM  Result Value Ref Range   WBC 10.0 4.0 - 10.5 K/uL   RBC 4.69 3.87 - 5.11 MIL/uL   Hemoglobin 14.3 12.0 - 15.0 g/dL   HCT 16.1 09.6 - 04.5 %   MCV 88.9 80.0 - 100.0 fL   MCH 30.5 26.0 - 34.0 pg  MCHC 34.3 30.0 - 36.0 g/dL   RDW 09.8 11.9 - 14.7 %   Platelets 217 150 - 400 K/uL   nRBC 0.0 0.0 - 0.2 %  Type and screen   Collection Time: 08/30/23 11:38 AM  Result Value Ref Range   ABO/RH(D) O POS    Antibody Screen NEG    Sample Expiration      09/02/2023,2359 Performed at [facility], [address] [place], [place] [address]   Results for orders placed or performed during the hospital encounter of 08/29/23 (from the past 2 weeks)  hCG, quantitative, pregnancy   Collection Time: 08/29/23 11:20 AM  Result Value Ref Range   hCG, Beta Chain, Quant, S >250,000 (H)  <5 mIU/mL    Imaging Studies: US  OB LESS THAN 14 WEEKS WITH OB TRANSVAGINAL Result Date: 08/29/2023 CLINICAL DATA:  Abnormal ultrasound in office. EXAM: OBSTETRIC <14 WK US  AND TRANSVAGINAL OB US  TECHNIQUE: Both transabdominal and transvaginal ultrasound examinations were performed for complete evaluation of the gestation as well as the maternal uterus, adnexal regions, and pelvic cul-de-sac. Transvaginal technique was performed to assess early pregnancy. COMPARISON:  Ultrasound of same day performed at outside facility. FINDINGS: Intrauterine gestational sac: None Yolk sac:  Not Visualized. Embryo:  Not Visualized. Cardiac Activity: Not Visualized. Maternal uterus/adnexae: Ovaries are unremarkable. No free fluid is noted. Endometrium is significantly thickened and heterogeneous. This is concerning for possible molar pregnancy. IMPRESSION: No definite evidence of intrauterine gestation. Endometrium is significantly thickened and heterogeneous concerning for possible molar pregnancy. Electronically Signed   By: Rosalene Colon M.D.   On: 08/29/2023 13:23   US  OB Limited Result Date: 08/29/2023 ----------------------------------------------------------------------  OBSTETRICS REPORT                       (Signed Final 08/29/2023 10:01 am) ---------------------------------------------------------------------- Patient Info  ID #:       [id number]                          D.O.B.:  2001/02/21 (22 yrs)(F)  Name:       [name] [facility]             Visit Date: 08/29/2023 09:32 am ---------------------------------------------------------------------- Performed By  Attending:        Fenton House          Ref. Address:     [address]                    MD                                                             [address]                                                             [place], [place]  [address]  Performed By:     Hortensia Ma RN         Location:         [facility]                                                             [facility]  Referred By:      [facility] ---------------------------------------------------------------------- Orders  #  Description                           Code        Ordered By  1  US  OB LIMITED                         40981.1     Fenton House ----------------------------------------------------------------------  #  Order #                     Accession #                Episode #  1  [id number]                   [id number]                 [id number] ---------------------------------------------------------------------- Indications  Weeks of gestation of pregnancy not            Z3A.00  specified ---------------------------------------------------------------------- Fetal Evaluation  Num Of Fetuses:         1  Preg. Location:         Intrauterine  Gest. Sac:              None seen  Yolk Sac:               Not visualized  Fetal Pole:             Not visualized  Cardiac Activity:       No embryo visualized ---------------------------------------------------------------------- Comments  Intrauterine GS, FP, and YS not present. Scan is concerning  for possible molar pregnancy. Dr. Alto Atta was consulted and  appropriate follow up advised. ---------------------------------------------------------------------- Impression  Uterus with heterogeneous contents and anechoic spaces.  No fetal pole visualized  Findings concerning for molar pregnancy ---------------------------------------------------------------------- Recommendations  Recommend formal ultrasound with radiology and evaluation  at [facility]. Findings discussed with the patient. ----------------------------------------------------------------------             Fenton House, MD Electronically Signed Final Report   08/29/2023 10:01 am  ----------------------------------------------------------------------  Assessment: Principal Problem:   Suspected Molar pregnancy   Plan: Patient will undergo surgical management with Dilation and Evacuation under ultrasound guidance.  Risks of surgery including bleeding, infection, injury to surrounding organs, need for additional procedures, possibility of intrauterine scarring which may impair future fertility, risk of retained products which may require further management and other postoperative/anesthesia complications were explained to patient.  Likelihood of success of complete evacuation of the uterus was discussed with the patient.  Written informed consent was obtained.  Patient has been NPO since last night  she will remain NPO for procedure. Anesthesia and OR aware.  Preoperative prophylactic Doxycycline  200mg  IV  has been ordered and is on call to the OR.  To OR when ready     Lenoard Rad, MD, FACOG Obstetrician & Gynecologist, [facility] for [employer], [facility]

## 2023-08-30 NOTE — Anesthesia Postprocedure Evaluation (Signed)
 Anesthesia Post Note  Patient: Bianca Webb  Procedure(s) Performed: DILATION AND EVACUATION, UTERUS (Uterus) ULTRASOUND GUIDANCE (Pelvis)     Patient location during evaluation: PACU Anesthesia Type: General Level of consciousness: awake and alert Pain management: pain level controlled Vital Signs Assessment: post-procedure vital signs reviewed and stable Respiratory status: spontaneous breathing, nonlabored ventilation and respiratory function stable Cardiovascular status: blood pressure returned to baseline and stable Postop Assessment: no apparent nausea or vomiting Anesthetic complications: no  No notable events documented.  Last Vitals:  Vitals:   08/30/23 1525 08/30/23 1535  BP:    Pulse: 76 65  Resp: (!) 21 17  Temp:    SpO2: 100% 100%    Last Pain:  Vitals:   08/30/23 1123  TempSrc: Oral  PainSc: 5                  [name],[name]

## 2023-08-30 NOTE — Discharge Instructions (Addendum)

## 2023-08-30 NOTE — Anesthesia Preprocedure Evaluation (Addendum)
 Anesthesia Evaluation  Patient identified by MRN, date of birth, ID band Patient awake    Reviewed: Allergy & Precautions, H&P , NPO status , Patient's Chart, lab work & pertinent test results  Airway Mallampati: II  TM Distance: >3 FB Neck ROM: Full    Dental no notable dental hx. (+) Teeth Intact, Dental Advisory Given   Pulmonary neg pulmonary ROS   Pulmonary exam normal breath sounds clear to auscultation       Cardiovascular negative cardio ROS  Rhythm:Regular Rate:Normal     Neuro/Psych negative neurological ROS  negative psych ROS   GI/Hepatic negative GI ROS, Neg liver ROS,,,  Endo/Other  negative endocrine ROS    Renal/GU negative Renal ROS  negative genitourinary   Musculoskeletal   Abdominal   Peds  Hematology negative hematology ROS (+)   Anesthesia Other Findings   Reproductive/Obstetrics negative OB ROS                             Anesthesia Physical Anesthesia Plan  ASA: 1  Anesthesia Plan: General   Post-op Pain Management: Ofirmev  IV (intra-op)* and Toradol IV (intra-op)*   Induction: Intravenous  PONV Risk Score and Plan: 4 or greater and Ondansetron , Dexamethasone , Propofol  infusion, TIVA and Midazolam   Airway Management Planned: LMA  Additional Equipment:   Intra-op Plan:   Post-operative Plan: Extubation in OR  Informed Consent: I have reviewed the patients History and Physical, chart, labs and discussed the procedure including the risks, benefits and alternatives for the proposed anesthesia with the patient or authorized representative who has indicated his/her understanding and acceptance.     Dental advisory given  Plan Discussed with: CRNA  Anesthesia Plan Comments:        Anesthesia Quick Evaluation

## 2023-08-30 NOTE — Op Note (Signed)
 Bianca Webb PROCEDURE DATE:  08/30/2023  PREOPERATIVE DIAGNOSIS: Abnormal pregnancy ultrasound with suspected molar pregnancy POSTOPERATIVE DIAGNOSIS: The same PROCEDURE:  Dilation and Evacuation under ultrasound guidance SURGEON:  Dr. Lenoard Rad  INDICATIONS: 23 y.o.  [name] with abnormal pregnancy ultrasound with suspected molar pregnancy needing surgical management.  Risks of surgery were discussed with the patient including but not limited to: bleeding which may require transfusion; infection which may require antibiotics; injury to uterus or surrounding organs; need for additional procedures including laparotomy or laparoscopy; possibility of intrauterine scarring which may impair future fertility; and other postoperative/anesthesia complications. Written informed consent was obtained.  FINDINGS:   10 week sized uterus. Significant amount of products of conception.  Empty endometrial stripe noted on ultrasound at the end of the procedure.   ANESTHESIA:    General, paracervical block with 30 ml of 0.5% Marcaine  ESTIMATED BLOOD LOSS:  50 ml. SPECIMENS:  Products of conception sent to pathology COMPLICATIONS:  None immediate.  PROCEDURE DETAILS:  The patient received intravenous Doxycycline  while in the preoperative area.  She was then taken to the operating room where anesthesia  was administered and was found to be adequate.  After an adequate timeout was performed, she was placed in the dorsal lithotomy position and examined; then prepped and draped in the sterile manner.   Her bladder was catheterized for an unmeasured amount of clear, yellow urine. A vaginal speculum was then placed in the patient's vagina and a single tooth tenaculum was applied to the anterior lip of the cervix.  A paracervical block using 30 ml of 0.5% Marcaine  was administered. The cervix was gently dilated to accommodate a 8 mm suction curette that was gently advanced to the uterine fundus.  The suction  device was then activated and curette slowly rotated to clear the uterus of products of conception.  A sharp curettage was then performed to confirm complete emptying of the uterus. There was an empty endometrial stripe noted on the ultrasound at the end of the curettage. There was minimal bleeding noted at the end of the procedure, and the tenaculum removed with good hemostasis noted.   All instruments were removed from the patient's vagina.  Sponge and instrument counts were correct times three.    The patient tolerated the procedure well and was taken to the recovery area awake, extubated and in stable condition.  The patient will be discharged to home as per PACU criteria.  Routine postoperative instructions given.  She was prescribed Oxycodone , Ibuprofen  and Colace.  She will follow up in the office in 2-3 weeks for postoperative evaluation.   Lenoard Rad, MD, FACOG Obstetrician & Gynecologist, [facility] for [employer], [facility]

## 2023-08-30 NOTE — Transfer of Care (Signed)
 Immediate Anesthesia Transfer of Care Note  Patient: Bianca Webb  Procedure(s) Performed: DILATION AND EVACUATION, UTERUS (Uterus) ULTRASOUND GUIDANCE (Pelvis)  Patient Location: PACU  Anesthesia Type:General  Level of Consciousness: sedated  Airway & Oxygen Therapy: Patient Spontanous Breathing  Post-op Assessment: Report given to RN  Post vital signs: stable  Last Vitals:  Vitals Value Taken Time  BP 92/57 08/30/23 1511  Temp    Pulse 78 08/30/23 1512  Resp 19 08/30/23 1512  SpO2 100 % 08/30/23 1512  Vitals shown include unfiled device data.  Last Pain:  Vitals:   08/30/23 1123  TempSrc: Oral  PainSc: 5       Patients Stated Pain Goal: 5 (08/30/23 1123)  Complications: No notable events documented.

## 2023-08-31 ENCOUNTER — Encounter (HOSPITAL_COMMUNITY): Payer: Self-pay | Admitting: Obstetrics & Gynecology

## 2023-09-01 ENCOUNTER — Encounter (HOSPITAL_COMMUNITY): Payer: Self-pay | Admitting: Obstetrics & Gynecology

## 2023-09-04 ENCOUNTER — Ambulatory Visit: Payer: Self-pay | Admitting: Obstetrics & Gynecology

## 2023-09-04 NOTE — Progress Notes (Signed)
 Pathology report is concerning for molar pregnancy as suspected.   Patient needs weekly labs for hCG levels until negative (HCG <5).  A plateau or rise in hCG may be indicative of the development of persistent trophoblastic disease, and will necessitate further evaluation and treatment. After HCG is negative, patients are followed with monthly hCG levels for a total of at least three months, to ensure the value continues to be negative. Patient should be strongly encouraged to be on contraception during this entire surveillance period.  A new pregnancy during this period would make it difficult or impossible to interpret hCG results and would complicate management.  Conception should only be attempted after this surveillance period.  Please call to inform patient of results and recommendations, this will be further discussed during her upcoming postoperative appointment with Dr. Dodie Frees on 09/16/23.   Lenoard Rad, MD

## 2023-09-09 ENCOUNTER — Encounter: Admitting: Obstetrics & Gynecology

## 2023-09-11 ENCOUNTER — Other Ambulatory Visit

## 2023-09-11 DIAGNOSIS — O020 Blighted ovum and nonhydatidiform mole: Secondary | ICD-10-CM

## 2023-09-12 ENCOUNTER — Ambulatory Visit: Payer: Self-pay | Admitting: Obstetrics and Gynecology

## 2023-09-12 LAB — BETA HCG QUANT (REF LAB): hCG Quant: 666 m[IU]/mL

## 2023-09-13 LAB — SURGICAL PATHOLOGY

## 2023-09-16 ENCOUNTER — Ambulatory Visit (INDEPENDENT_AMBULATORY_CARE_PROVIDER_SITE_OTHER): Admitting: Obstetrics and Gynecology

## 2023-09-16 ENCOUNTER — Encounter: Payer: Self-pay | Admitting: Obstetrics and Gynecology

## 2023-09-16 VITALS — BP 98/64 | HR 90 | Wt 162.0 lb

## 2023-09-16 DIAGNOSIS — Z9889 Other specified postprocedural states: Secondary | ICD-10-CM

## 2023-09-16 DIAGNOSIS — O020 Blighted ovum and nonhydatidiform mole: Secondary | ICD-10-CM

## 2023-09-16 NOTE — Progress Notes (Signed)
 23 yo here for post op check following D&E. Patient reports feeling well without minimal bleeding or pain. She is without concerns.  Past Medical History:  Diagnosis Date   Medical history non-contributory    Past Surgical History:  Procedure Laterality Date   DILATION AND EVACUATION N/A 08/30/2023   Procedure: DILATION AND EVACUATION, UTERUS;  Surgeon: Julianne Octave, MD;  Location: [facility] OR;  Service: Gynecology;  Laterality: N/A;   NO PAST SURGERIES     ULTRASOUND GUIDANCE  08/30/2023   Procedure: ULTRASOUND GUIDANCE;  Surgeon: Julianne Octave, MD;  Location: [facility] OR;  Service: Gynecology;;   Family History  Problem Relation Age of Onset   Healthy Mother    Healthy Father    Cancer Neg Hx    Hypertension Neg Hx    Eclampsia Neg Hx    Diabetes Neg Hx    Social History   Tobacco Use   Smoking status: Never   Smokeless tobacco: Never  Vaping Use   Vaping status: Never Used  Substance Use Topics   Alcohol use: No   Drug use: Not Currently    Frequency: 4.0 times per week    Types: Marijuana   ROS See pertinent in HPI. All other systems reviewed and non contributory Blood pressure 98/64, pulse 90, weight 162 lb (73.5 kg), last menstrual period 06/15/2023, unknown if currently breastfeeding. GENERAL: Well-developed, well-nourished female in no acute distress.  NEURO: alert and oriented x 3  A/P 23 yo here for post op check  - Reviewed pathology results consistent with complete molar pregnancy - Discussed importance of HCG surveillance for the next 6 months due to high risk for choriocarcinoma - Patient declined hormonal contraception and plans to use condoms - Patient verbalized understanding and plans to conceive once cleared in December

## 2023-09-16 NOTE — Progress Notes (Signed)
 Pt is in office for post op appt. Pt would like repeat quant today.  Pt is interested in another pregnancy.

## 2023-09-17 ENCOUNTER — Ambulatory Visit: Payer: Self-pay | Admitting: Obstetrics and Gynecology

## 2023-09-17 LAB — BETA HCG QUANT (REF LAB): hCG Quant: 181 m[IU]/mL

## 2023-10-02 ENCOUNTER — Other Ambulatory Visit

## 2023-10-03 ENCOUNTER — Other Ambulatory Visit

## 2023-10-07 ENCOUNTER — Other Ambulatory Visit

## 2023-10-07 DIAGNOSIS — O020 Blighted ovum and nonhydatidiform mole: Secondary | ICD-10-CM

## 2023-10-08 ENCOUNTER — Ambulatory Visit: Payer: Self-pay | Admitting: Obstetrics & Gynecology

## 2023-10-08 LAB — BETA HCG QUANT (REF LAB): hCG Quant: 10 m[IU]/mL
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
## 2023-10-14 ENCOUNTER — Other Ambulatory Visit

## 2023-10-16 ENCOUNTER — Ambulatory Visit: Admitting: Physician Assistant

## 2024-01-05 ENCOUNTER — Emergency Department (HOSPITAL_COMMUNITY)
Admission: EM | Admit: 2024-01-05 | Discharge: 2024-01-05 | Disposition: A | Discharge status: Home / Self Care | Attending: Emergency Medicine | Admitting: Emergency Medicine

## 2024-01-05 ENCOUNTER — Ambulatory Visit
Admission: EM | Admit: 2024-01-05 | Discharge: 2024-01-05 | Disposition: A | Discharge status: Home / Self Care | Attending: Family Medicine | Admitting: Family Medicine

## 2024-01-05 ENCOUNTER — Emergency Department (HOSPITAL_COMMUNITY)

## 2024-01-05 ENCOUNTER — Encounter (HOSPITAL_COMMUNITY): Payer: Self-pay

## 2024-01-05 DIAGNOSIS — S0011XA Contusion of right eyelid and periocular area, initial encounter: Secondary | ICD-10-CM | POA: Diagnosis not present

## 2024-01-05 DIAGNOSIS — S0590XA Unspecified injury of unspecified eye and orbit, initial encounter: Secondary | ICD-10-CM

## 2024-01-05 DIAGNOSIS — H1131 Conjunctival hemorrhage, right eye: Secondary | ICD-10-CM | POA: Insufficient documentation

## 2024-01-05 DIAGNOSIS — S0591XA Unspecified injury of right eye and orbit, initial encounter: Secondary | ICD-10-CM | POA: Diagnosis present

## 2024-01-05 MED ORDER — TETRACAINE HCL 0.5 % OP SOLN
2.0000 [drp] | Freq: Once | OPHTHALMIC | Status: AC
  Administered 2024-01-05: 2 [drp] via OPHTHALMIC
  Filled 2024-01-05: qty 4

## 2024-01-05 MED ORDER — FLUORESCEIN SODIUM 1 MG OP STRP
1.0000 | ORAL_STRIP | Freq: Once | OPHTHALMIC | Status: AC
  Administered 2024-01-05: 1 via OPHTHALMIC
  Filled 2024-01-05: qty 1

## 2024-01-05 MED ORDER — ERYTHROMYCIN 5 MG/GM OP OINT: TOPICAL_OINTMENT | OPHTHALMIC | 0 refills | Status: DC
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
## 2024-01-05 NOTE — Discharge Instructions (Signed)
 You were seen for your eye injury in the emergency department.   At home, please use the eye ointment we gave you.    Check your MyChart online for the results of any tests that had not resulted by the time you left the emergency department.   Follow-up with an ophthalmologist in 2-3 days regarding your visit.    Return immediately to the emergency department if you experience any of the following: Worsening pain, or any other concerning symptoms.    Thank you for visiting our Emergency Department. It was a pleasure taking care of you today.
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
## 2024-01-05 NOTE — ED Provider Notes (Signed)
   EMERGENCY DEPARTMENT AT [facility] Provider Note   CSN: [id number] Arrival date & time: 01/05/24  1502     Patient presents with: No chief complaint on file.   Bianca Webb is a 23 y.o. female.  {Add pertinent medical, surgical, social history, OB history to HPI:32947} HPI     Prior to Admission medications   Medication Sig Start Date End Date Taking? Authorizing Provider  acetaminophen  (TYLENOL ) 500 MG tablet Take 2 tablets (1,000 mg total) by mouth every 6 (six) hours as needed for moderate pain (pain score 4-6) or mild pain (pain score 1-3). 08/30/23   Anyanwu, Ugonna A, MD  docusate sodium  (COLACE) 100 MG capsule Take 1 capsule (100 mg total) by mouth 2 (two) times daily as needed for mild constipation or moderate constipation. 08/30/23   Anyanwu, Ugonna A, MD  ibuprofen  (ADVIL ) 800 MG tablet Take 1 tablet (800 mg total) by mouth 3 (three) times daily with meals as needed for headache, moderate pain (pain score 4-6) or cramping. 08/30/23   Anyanwu, Gloris LABOR, MD  oxyCODONE  (OXY IR/ROXICODONE ) 5 MG immediate release tablet Take 1 tablet (5 mg total) by mouth every 4 (four) hours as needed for severe pain (pain score 7-10) or breakthrough pain. 08/30/23   Anyanwu, Ugonna A, MD  ferrous sulfate  325 (65 FE) MG tablet Take 1 tablet (325 mg total) by mouth daily with breakfast. Patient not taking: Reported on 01/29/2019 12/25/18 01/22/20  Dotti Mitzie SAILOR, MD    Allergies: Patient has no known allergies.    Review of Systems  Updated Vital Signs BP 110/69   Pulse 71   Temp 98.5 F (36.9 C)   Resp 18   LMP 12/15/2023   SpO2 99%   Physical Exam  (all labs ordered are listed, but only abnormal results are displayed) Labs Reviewed - No data to display  EKG: None  Radiology: No results found.  {Document cardiac monitor, telemetry assessment procedure when appropriate:32947} Procedures   Medications Ordered in the ED  tetracaine (PONTOCAINE) 0.5 %  ophthalmic solution 2 drop (has no administration in time range)  fluorescein ophthalmic strip 1 strip (has no administration in time range)      {Click here for ABCD2, HEART and other calculators REFRESH Note before signing:1}                              Medical Decision Making Risk Prescription drug management.   ***  {Document critical care time when appropriate  Document review of labs and clinical decision tools ie CHADS2VASC2, etc  Document your independent review of radiology images and any outside records  Document your discussion with family members, caretakers and with consultants  Document social determinants of health affecting pt's care  Document your decision making why or why not admission, treatments were needed:32947:::1}   Final diagnoses:  None    ED Discharge Orders     None

## 2024-01-05 NOTE — Discharge Instructions (Signed)
 You are in need of a higher level of care than we can provide in the urgent care setting. You will likely need a CT scan of the orbit of your right eye. Please go straight to the emergency room now.

## 2024-01-05 NOTE — ED Notes (Signed)
 Patient is being discharged from the Urgent Care and sent to the Emergency Department via POV . Per [place], [place], patient is in need of higher level of care due to eye injury. Patient is aware and verbalizes understanding of plan of care.  Vitals:   01/05/24 1403  BP: 108/73  Pulse: 71  Resp: 16  Temp: 99.1 F (37.3 C)  SpO2: 98%

## 2024-02-24 ENCOUNTER — Ambulatory Visit

## 2024-02-25 ENCOUNTER — Ambulatory Visit

## 2024-02-25 VITALS — BP 115/72 | HR 108 | Wt 150.0 lb

## 2024-02-25 DIAGNOSIS — Z3200 Encounter for pregnancy test, result unknown: Secondary | ICD-10-CM

## 2024-02-25 LAB — POCT URINE PREGNANCY: Preg Test, Ur: POSITIVE — AB

## 2024-02-25 MED ORDER — CVS PRENATAL GUMMY 0.4 MG PO CHEW: 3.0000 | CHEWABLE_TABLET | Freq: Every day | ORAL | 11 refills | Status: AC

## 2024-02-25 NOTE — Progress Notes (Signed)
..  Bianca Webb presents today for UPT. She has no unusual complaints. LMP: 01/13/24    OBJECTIVE: Appears well, in no apparent distress.  OB History     Gravida  4   Para  1   Term  1   Preterm  0   AB  2   Living  1      SAB  0   IAB  1   Ectopic  0   Multiple  0   Live Births  1          Home UPT Result: Positive In-Office UPT result: Positive I have reviewed the patient's medical, obstetrical, social, and family histories, and medications.   ASSESSMENT: Positive pregnancy test  PLAN Prenatal care to be completed at: [facility] sent to pharmacy Safe med list provided

## 2024-03-16 ENCOUNTER — Other Ambulatory Visit (INDEPENDENT_AMBULATORY_CARE_PROVIDER_SITE_OTHER): Payer: Self-pay

## 2024-03-16 ENCOUNTER — Ambulatory Visit (INDEPENDENT_AMBULATORY_CARE_PROVIDER_SITE_OTHER): Admitting: *Deleted

## 2024-03-16 VITALS — BP 101/68 | HR 88 | Wt 153.6 lb

## 2024-03-16 DIAGNOSIS — O3680X0 Pregnancy with inconclusive fetal viability, not applicable or unspecified: Secondary | ICD-10-CM

## 2024-03-16 DIAGNOSIS — O0990 Supervision of high risk pregnancy, unspecified, unspecified trimester: Secondary | ICD-10-CM | POA: Insufficient documentation

## 2024-03-16 DIAGNOSIS — O3680X1 Pregnancy with inconclusive fetal viability, fetus 1: Secondary | ICD-10-CM

## 2024-03-16 DIAGNOSIS — Z3A01 Less than 8 weeks gestation of pregnancy: Secondary | ICD-10-CM
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
## 2024-03-17 LAB — BETA HCG QUANT (REF LAB): hCG Quant: 39074 m[IU]/mL

## 2024-03-17 NOTE — Progress Notes (Signed)
 Bianca Webb presented for OB Intake/ US . GA by LMP [redacted]w[redacted]d. GS and YS visualized on today's US . No FP was visualized. Recent molar pregnancy 08/2023. Dr. Zina was consulted. Advised repeat US  in 2 weeks and draw bHCG today.

## 2024-03-18 ENCOUNTER — Ambulatory Visit: Payer: Self-pay | Admitting: Obstetrics and Gynecology

## 2024-03-18 LAB — CULTURE, OB URINE

## 2024-03-18 LAB — URINE CULTURE, OB REFLEX

## 2024-03-23 ENCOUNTER — Ambulatory Visit

## 2024-03-23 ENCOUNTER — Other Ambulatory Visit (INDEPENDENT_AMBULATORY_CARE_PROVIDER_SITE_OTHER): Payer: Self-pay

## 2024-03-23 VITALS — BP 116/71 | HR 69 | Wt 151.9 lb

## 2024-03-23 DIAGNOSIS — O0990 Supervision of high risk pregnancy, unspecified, unspecified trimester: Secondary | ICD-10-CM

## 2024-03-23 DIAGNOSIS — O3680X0 Pregnancy with inconclusive fetal viability, not applicable or unspecified: Secondary | ICD-10-CM

## 2024-03-23 DIAGNOSIS — Z3A01 Less than 8 weeks gestation of pregnancy: Secondary | ICD-10-CM | POA: Diagnosis not present

## 2024-03-23 NOTE — Progress Notes (Signed)
 Marguerite Berland presents for repeat US  today. GA by LMP [redacted]w[redacted]d. Previous US  03/16/24 showed intrauterine GS and YS only. No FP visualized. bHCG 39,000. Today's US  shows GS and YS and no FP. Dr. Zina was consulted and advised repeat US  in 2 wks. Pt advised of above and SAB precautions were reviewed. Pt and boyfriend verbalized understanding. Repeat US  scheduled at checkout.

## 2024-03-24 ENCOUNTER — Inpatient Hospital Stay (HOSPITAL_COMMUNITY)
Admission: AD | Admit: 2024-03-24 | Discharge: 2024-03-24 | Disposition: A | Discharge status: Home / Self Care | Attending: Obstetrics and Gynecology | Admitting: Obstetrics and Gynecology

## 2024-03-24 DIAGNOSIS — O0990 Supervision of high risk pregnancy, unspecified, unspecified trimester: Secondary | ICD-10-CM

## 2024-03-24 DIAGNOSIS — O26851 Spotting complicating pregnancy, first trimester: Secondary | ICD-10-CM | POA: Diagnosis present

## 2024-03-24 DIAGNOSIS — R7989 Other specified abnormal findings of blood chemistry: Secondary | ICD-10-CM

## 2024-03-24 DIAGNOSIS — O0991 Supervision of high risk pregnancy, unspecified, first trimester: Secondary | ICD-10-CM | POA: Insufficient documentation

## 2024-03-24 DIAGNOSIS — Z3A10 10 weeks gestation of pregnancy: Secondary | ICD-10-CM | POA: Insufficient documentation

## 2024-03-24 DIAGNOSIS — N939 Abnormal uterine and vaginal bleeding, unspecified: Secondary | ICD-10-CM

## 2024-03-24 LAB — CBC
HCT: 37.6 % (ref 36.0–46.0)
Hemoglobin: 12.7 g/dL (ref 12.0–15.0)
MCH: 31 pg (ref 26.0–34.0)
MCHC: 33.8 g/dL (ref 30.0–36.0)
MCV: 91.7 fL (ref 80.0–100.0)
Platelets: 230 K/uL (ref 150–400)
RBC: 4.1 MIL/uL (ref 3.87–5.11)
RDW: 13.2 % (ref 11.5–15.5)
WBC: 8.4 K/uL (ref 4.0–10.5)
nRBC: 0 % (ref 0.0–0.2)

## 2024-03-24 LAB — HCG, QUANTITATIVE, PREGNANCY: hCG, Beta Chain, Quant, S: 36289 m[IU]/mL — ABNORMAL HIGH (ref <5)

## 2024-03-24 NOTE — MAU Note (Signed)
 Bianca Webb is a 23 y.o. at [redacted]w[redacted]d here in MAU reporting hx molar pregnancy. She had some light bleeding yesterday and some about an hour ago. Bleeding has been reddish pink and not a lot. No pain. Did have intercourse yesteday.   LMP: 01/13/24 Onset of complaint: yesterday Pain score: 0 Vitals:   03/24/24 2128 03/24/24 2129  BP:  110/69  Pulse: 72   Resp:    Temp:    SpO2: 100%      FHT: na  Lab orders placed from triage: none from Triage.  Labs drawn in triage

## 2024-03-24 NOTE — MAU Provider Note (Signed)
 Chief Complaint:  Vaginal Bleeding   HPI   None     Bianca Webb is a 23 y.o. [id number] at [redacted]w[redacted]d who presents to maternity admissions reporting vaginal spotting for 2 days. Started yesterday after sexual intercourse. Describes as spotting. Seeing light pink blood when wiping, denies blood in her underwear. Denies abdominal pain, vaginal discharge, odor. Has a history of a molar pregnancy. Notes that she had an appointment yesterday with an ultrasound showing IUGS and YS but no fetal pole, plan for repeat ultrasound on 04/06/2024.   Pregnancy Course: ***  Past Medical History:  Diagnosis Date   Medical history non-contributory    OB History  Gravida Para Term Preterm AB Living  4 1 1  0 2 1  SAB IAB Ectopic Multiple Live Births  0 1 0 0 1    # Outcome Date GA Lbr Len/2nd Weight Sex Type Anes PTL Lv  4 Current           3 Molar 09/2023          2 IAB 04/2021     TAB     1 Term 12/24/18 [redacted]w[redacted]d 20:06 / 00:20 3490 g F Vag-Spont EPI  LIV   Past Surgical History:  Procedure Laterality Date   DILATION AND EVACUATION N/A 08/30/2023   Procedure: DILATION AND EVACUATION, UTERUS;  Surgeon: Herchel Gloris LABOR, MD;  Location: [facility] OR;  Service: Gynecology;  Laterality: N/A;   ULTRASOUND GUIDANCE  08/30/2023   Procedure: ULTRASOUND GUIDANCE;  Surgeon: Herchel Gloris LABOR, MD;  Location: [facility] OR;  Service: Gynecology;;   Family History  Problem Relation Age of Onset   Healthy Mother    Healthy Father    Cancer Neg Hx    Hypertension Neg Hx    Eclampsia Neg Hx    Diabetes Neg Hx    Social History[1] Allergies[2] Medications Prior to Admission  Medication Sig Dispense Refill Last Dose/Taking   acetaminophen  (TYLENOL ) 500 MG tablet Take 2 tablets (1,000 mg total) by mouth every 6 (six) hours as needed for moderate pain (pain score 4-6) or mild pain (pain score 1-3). 100 tablet 0    Prenatal Multivit-Min-FA ([facility] PRENATAL GUMMY) 0.4 MG CHEW Chew 3 tablets by mouth daily. 90 tablet 11     I  have reviewed patient's Past Medical Hx, Surgical Hx, Family Hx, Social Hx, medications and allergies.   ROS  Pertinent items noted in HPI and remainder of comprehensive ROS otherwise negative.   PHYSICAL EXAM  Patient Vitals for the past 24 hrs:  BP Temp Pulse Resp SpO2 Height Weight  03/24/24 2129 110/69 -- -- -- -- -- --  03/24/24 2128 -- -- 72 -- 100 % 5' 2 (1.575 m) 68.9 kg  03/24/24 2124 -- 98.7 F (37.1 C) -- 17 -- -- --    Constitutional: Well-developed, well-nourished female in no acute distress.  Cardiovascular: normal rate & rhythm, warm and well-perfused Respiratory: normal effort, no problems with respiration noted GI: Abd soft, non-tender, non-distended MS: Extremities nontender, no edema, normal ROM Neurologic: Alert and oriented x 4.  GU: no CVA tenderness Pelvic: deferred         Labs: Results for orders placed or performed during the hospital encounter of 03/24/24 (from the past 24 hours)  hCG, quantitative, pregnancy     Status: Abnormal   Collection Time: 03/24/24  9:27 PM  Result Value Ref Range   hCG, Beta Chain, Quant, S 36,289 (H) <5 mIU/mL  CBC  Status: None   Collection Time: 03/24/24  9:27 PM  Result Value Ref Range   WBC 8.4 4.0 - 10.5 K/uL   RBC 4.10 3.87 - 5.11 MIL/uL   Hemoglobin 12.7 12.0 - 15.0 g/dL   HCT 62.3 63.9 - 53.9 %   MCV 91.7 80.0 - 100.0 fL   MCH 31.0 26.0 - 34.0 pg   MCHC 33.8 30.0 - 36.0 g/dL   RDW 86.7 88.4 - 84.4 %   Platelets 230 150 - 400 K/uL   nRBC 0.0 0.0 - 0.2 %    Imaging:  No results found.  MDM & MAU COURSE  MDM: {[id number]}  MAU Course: Orders Placed This Encounter  Procedures   hCG, quantitative, pregnancy   CBC   No orders of the defined types were placed in this encounter.  VSS. Exam unremarkable. Offered wet prep and CT/NG swabs, declined. Given recent appointment and sexual intercourse immediately prior to spotting, patient reassured. All questions answered prior to  discharge.  ASSESSMENT   1. Supervision of high risk pregnancy, antepartum     PLAN  Discharge home in stable condition with return precautions.  ***    Allergies as of 03/24/2024   No Known Allergies   Med Rec must be completed prior to using this SMARTLINK***       Charlie Courts, MD  Family Medicine - Obstetrics Fellow        [1]  Social History Tobacco Use   Smoking status: Never   Smokeless tobacco: Never  Vaping Use   Vaping status: Never Used  Substance Use Topics   Alcohol use: Not Currently    Comment: occ   Drug use: Not Currently    Types: Marijuana  [2] No Known Allergies

## 2024-03-24 NOTE — Progress Notes (Signed)
 Dr Jomarie in to see pt and discuss plan of care and d/c plan. Written and verbal d/c instructions given and pt voiced understanding

## 2024-03-30 ENCOUNTER — Encounter

## 2024-03-31 ENCOUNTER — Inpatient Hospital Stay (HOSPITAL_COMMUNITY)

## 2024-03-31 ENCOUNTER — Other Ambulatory Visit: Payer: Self-pay

## 2024-03-31 ENCOUNTER — Inpatient Hospital Stay (HOSPITAL_COMMUNITY)
Admission: AD | Admit: 2024-03-31 | Discharge: 2024-03-31 | Disposition: A | Discharge status: Home / Self Care | Attending: Obstetrics & Gynecology | Admitting: Obstetrics & Gynecology

## 2024-03-31 DIAGNOSIS — O0990 Supervision of high risk pregnancy, unspecified, unspecified trimester: Secondary | ICD-10-CM

## 2024-03-31 DIAGNOSIS — O039 Complete or unspecified spontaneous abortion without complication: Secondary | ICD-10-CM | POA: Diagnosis not present

## 2024-03-31 DIAGNOSIS — O26851 Spotting complicating pregnancy, first trimester: Secondary | ICD-10-CM

## 2024-03-31 DIAGNOSIS — B9689 Other specified bacterial agents as the cause of diseases classified elsewhere: Secondary | ICD-10-CM

## 2024-03-31 DIAGNOSIS — Z3A14 14 weeks gestation of pregnancy: Secondary | ICD-10-CM | POA: Diagnosis present

## 2024-03-31 DIAGNOSIS — O209 Hemorrhage in early pregnancy, unspecified: Secondary | ICD-10-CM | POA: Diagnosis present

## 2024-03-31 LAB — WET PREP, GENITAL
Sperm: NONE SEEN
Trich, Wet Prep: NONE SEEN
WBC, Wet Prep HPF POC: <10
Yeast Wet Prep HPF POC: NONE SEEN

## 2024-03-31 LAB — URINALYSIS, ROUTINE W REFLEX MICROSCOPIC
Bilirubin Urine: NEGATIVE
Glucose, UA: NEGATIVE mg/dL
Hgb urine dipstick: NEGATIVE
Ketones, ur: NEGATIVE mg/dL
Leukocytes,Ua: NEGATIVE
Nitrite: NEGATIVE
Protein, ur: NEGATIVE mg/dL
Specific Gravity, Urine: 1.025 (ref 1.005–1.030)
pH: 7 (ref 5.0–8.0)

## 2024-03-31 LAB — HCG, QUANTITATIVE, PREGNANCY: hCG, Beta Chain, Quant, S: 23141 m[IU]/mL — ABNORMAL HIGH

## 2024-03-31 LAB — CBC
HCT: 36.9 % (ref 36.0–46.0)
Hemoglobin: 12.5 g/dL (ref 12.0–15.0)
MCH: 30.7 pg (ref 26.0–34.0)
MCHC: 33.9 g/dL (ref 30.0–36.0)
MCV: 90.7 fL (ref 80.0–100.0)
Platelets: 219 K/uL (ref 150–400)
RBC: 4.07 MIL/uL (ref 3.87–5.11)
RDW: 13.2 % (ref 11.5–15.5)
WBC: 8.2 K/uL (ref 4.0–10.5)
nRBC: 0 % (ref 0.0–0.2)

## 2024-03-31 NOTE — MAU Note (Signed)
 Bianca Webb is a 23 y.o. at [redacted]w[redacted]d here in MAU reporting: she was seen last week in MAU for VB, but had had recent intercourse.  Reports the VB has stopped but has resumed.  States had VB 2 days ago, stopped and returned today.  States was spotting with wiping this am and has continued throughout the day.  Denies recent intercourse.  LMP: 01/13/2024 Onset of complaint: today Pain score: 0 Vitals:   03/31/24 1446  BP: 108/73  Pulse: 80  Resp: 19  Temp: 99.3 F (37.4 C)  SpO2: 100%     FHT: None heard, pt reports had a yolk sac 2 weeks ago  Lab orders placed from triage: UA

## 2024-03-31 NOTE — MAU Provider Note (Signed)
 Chief Complaint:  Vaginal Bleeding  HPI  Bianca Webb is a 23 y.o. [id number] who presents to maternity admissions reporting vaginal bleeding. She reports she had recent intercourse. She states she experienced spotting when wiping after using the bathroom this morning and the spotting has continued throughout the day. She denies sharp abdominal pain, heavy bleeding, fever/chills and vaginal itching/burning.   She was seen in MAU for vaginal bleeding on 03/24/2024.  Past Medical History: Past Medical History:  Diagnosis Date   Medical history non-contributory    Past obstetric history: OB History  Gravida Para Term Preterm AB Living  4 1 1  0 2 1  SAB IAB Ectopic Multiple Live Births  0 1 0 0 1    # Outcome Date GA Lbr Len/2nd Weight Sex Type Anes PTL Lv  4 Current           3 Molar 09/2023          2 IAB 04/2021     TAB     1 Term 12/24/18 [redacted]w[redacted]d 20:06 / 00:20 3490 g F Vag-Spont EPI  LIV   Past Surgical History: Past Surgical History:  Procedure Laterality Date   DILATION AND EVACUATION N/A 08/30/2023   Procedure: DILATION AND EVACUATION, UTERUS;  Surgeon: Herchel Gloris LABOR, MD;  Location: [facility] OR;  Service: Gynecology;  Laterality: N/A;   ULTRASOUND GUIDANCE  08/30/2023   Procedure: ULTRASOUND GUIDANCE;  Surgeon: Herchel Gloris LABOR, MD;  Location: [facility] OR;  Service: Gynecology;;   Family History: Family History  Problem Relation Age of Onset   Healthy Mother    Healthy Father    Cancer Neg Hx    Hypertension Neg Hx    Eclampsia Neg Hx    Diabetes Neg Hx    Social History: Social History[1]  Allergies: Allergies[2]  Meds:  No medications prior to admission.   I have reviewed patient's Past Medical Hx, Surgical Hx, Family Hx, Social Hx, medications and allergies.  ROS  Review of Systems  Constitutional: Negative.   Genitourinary: Negative.   Neurological: Negative.   Psychiatric/Behavioral: Negative.       PHYSICAL EXAM  Patient Vitals for the past 24 hrs:   BP Temp Temp src Pulse Resp SpO2 Weight  03/31/24 2017 115/78 -- -- 84 -- 100 % --  03/31/24 1446 108/73 99.3 F (37.4 C) Oral 80 19 100 % --  03/31/24 1440 -- -- -- -- -- -- 69.9 kg   Constitutional: Well-developed, well-nourished female in no acute distress.  Cardiovascular: normal rate and rhythm Respiratory: normal effort, no distress GI: Abd soft, non-tender, non-distended MS: Extremities nontender, no edema, normal ROM Neurologic: Alert and oriented x 4.    Skin:  Warm and Dry Psych:  Affect appropriate. Pelvic: Deferred   Labs: Results for orders placed or performed during the hospital encounter of 03/31/24 (from the past 24 hours)  Wet prep, genital     Status: Abnormal   Collection Time: 03/31/24  3:12 PM   Specimen: PATH Cytology Cervicovaginal Ancillary Only  Result Value Ref Range   Yeast Wet Prep HPF POC NONE SEEN NONE SEEN   Trich, Wet Prep NONE SEEN NONE SEEN   Clue Cells Wet Prep HPF POC PRESENT (A) NONE SEEN   WBC, Wet Prep HPF POC <10 <10   Sperm NONE SEEN   Urinalysis, Routine w reflex microscopic -Urine, Clean Catch     Status: Abnormal   Collection Time: 03/31/24  3:28 PM  Result Value Ref Range  Color, Urine YELLOW YELLOW   APPearance CLOUDY (A) CLEAR   Specific Gravity, Urine 1.025 1.005 - 1.030   pH 7.0 5.0 - 8.0   Glucose, UA NEGATIVE NEGATIVE mg/dL   Hgb urine dipstick NEGATIVE NEGATIVE   Bilirubin Urine NEGATIVE NEGATIVE   Ketones, ur NEGATIVE NEGATIVE mg/dL   Protein, ur NEGATIVE NEGATIVE mg/dL   Nitrite NEGATIVE NEGATIVE   Leukocytes,Ua NEGATIVE NEGATIVE  ABO/Rh     Status: None   Collection Time: 03/31/24  3:50 PM  Result Value Ref Range   ABO/RH(D)      O POS Performed at [facility], [address] [place], [place] [address]   CBC     Status: None   Collection Time: 03/31/24  3:51 PM  Result Value Ref Range   WBC 8.2 4.0 - 10.5 K/uL   RBC 4.07 3.87 - 5.11 MIL/uL   Hemoglobin 12.5 12.0 - 15.0 g/dL   HCT 63.0 63.9 - 53.9 %    MCV 90.7 80.0 - 100.0 fL   MCH 30.7 26.0 - 34.0 pg   MCHC 33.9 30.0 - 36.0 g/dL   RDW 86.7 88.4 - 84.4 %   Platelets 219 150 - 400 K/uL   nRBC 0.0 0.0 - 0.2 %  hCG, quantitative, pregnancy     Status: Abnormal   Collection Time: 03/31/24  3:51 PM  Result Value Ref Range   hCG, Beta Chain, Quant, S 23,141 (H) <5 mIU/mL   --/--/O POS Performed at [facility], [address] [place], [place] [address]  [phone]/23 1550)  Imaging:  US  OB LESS THAN 14 WEEKS WITH OB TRANSVAGINAL Result Date: 03/31/2024 CLINICAL DATA:  Vaginal bleeding in pregnancy. EXAM: OBSTETRIC <14 WK US  AND TRANSVAGINAL OB US  TECHNIQUE: Both transabdominal and transvaginal ultrasound examinations were performed for complete evaluation of the gestation as well as the maternal uterus, adnexal regions, and pelvic cul-de-sac. Transvaginal technique was performed to assess early pregnancy. COMPARISON:  Ultrasound dated 03/23/2024 and 03/16/2024 FINDINGS: Intrauterine gestational sac: Single intrauterine gestational sac. Yolk sac:  Seen Embryo:  Not present Cardiac Activity: N/A MSD: 15 mm   6 w   2 d Subchorionic hemorrhage:  None visualized. Maternal uterus/adnexae: The maternal ovaries are unremarkable. IMPRESSION: Single intrauterine gestational sac. No fetal pole identified. Radiographic features diagnostic of early pregnancy loss. Obstetrical consult is advised. Electronically Signed   By: Vanetta Chou M.D.   On: 03/31/2024 19:18    MDM & MAU COURSE  MDM: Moderate  MAU Course: 1551: Assessment  - Labs ordered (hcG, CBC, ABO/Rh, UA, GC/C, and Wet Prep.   - US  OB Less Than 14 weeks with OB transvaginal ordered.  1956: Reassessment  - Wet prep results and US  OB Less Than 14 weeks discussed with patient.                Patient has BV and US  OB Less Than 14 weeks showed pregnancy loss.  ASSESSMENT   1. Spotting affecting pregnancy in first trimester   2. Supervision of high risk pregnancy, antepartum   3.  Bacterial vaginosis   4. Miscarriage    PLAN  1. Bacterial Vaginosis -  Patient has Flagyl  gel at home and states she did not need a prescription. -  Patient instructed to insert intravaginally nightly for 5 days.   2. Miscarriage  - Options to manage miscarriage discussed with patient. Patient opted for a  D&C. Message sent to schedule D&C with OB surgeon (03/31/2024). Patient                informed that someone will contact her.   3. Spotting affecting pregnancy in first trimester  - What to be expected after miscarriage discussed with patient which includes                spotting and cramping. Patient recommended that OTC ibuprofen  and                Tylenol  can  be taken in efforts to manage discomfort as needed.  - Strict return precautions discussed with patient and education included in                AVS which includes to return if experiencing fever/chills, heavy bleeding,                sharp abdominal pain, or soaking more than 1 pad in 2 hours. Patient                verbalized understanding and had no further questions.   Discharge home in stable condition with strict return precautions.   Encouraged to return here or to other Urgent Care/ED if she develops worsening of symptoms, increase in pain, fever, or other concerning symptoms.   Allergies as of 03/31/2024   No Known Allergies      Medication List     TAKE these medications    Acetaminophen  Extra Strength 500 MG Tabs Take 2 tablets (1,000 mg total) by mouth every 6 (six) hours as needed for moderate pain (pain score 4-6) or mild pain (pain score 1-3).   [facility] Prenatal Gummy 0.4 MG Chew Chew 3 tablets by mouth daily.       Erich Kochan, MSN, CNM, CPN 03/31/2024 8:24 PM      [1]  Social History Tobacco Use   Smoking status: Never   Smokeless tobacco: Never  Vaping Use   Vaping status: Never Used  Substance Use Topics   Alcohol use: Not Currently    Comment: occ   Drug use:  Not Currently    Types: Marijuana  [2] No Known Allergies

## 2024-04-01 ENCOUNTER — Other Ambulatory Visit (HOSPITAL_COMMUNITY): Payer: Self-pay | Admitting: Obstetrics and Gynecology

## 2024-04-01 ENCOUNTER — Telehealth: Payer: Self-pay

## 2024-04-01 DIAGNOSIS — O021 Missed abortion: Secondary | ICD-10-CM

## 2024-04-01 LAB — GC/CHLAMYDIA PROBE AMP (~~LOC~~) NOT AT ARMC
Chlamydia: NEGATIVE
Comment: NEGATIVE
Comment: NORMAL
Neisseria Gonorrhea: NEGATIVE

## 2024-04-01 LAB — ABO/RH: ABO/RH(D): O POS

## 2024-04-01 NOTE — Telephone Encounter (Signed)
 I called patient to provide emergency surgery details. I left a voicemail stating that patient is scheduled at [facility] at 1 pm on 04/03/24 and to please notify me if the surgery needed to be cancelled.

## 2024-04-01 NOTE — Progress Notes (Signed)
 SDW call. Spoke to patient, she has decided not to have surgery on Friday and would like to to oral medications instead.  Secure chat sent to [facility] and Dr. Abigail. Patient removed from surgery schedule.

## 2024-04-03 ENCOUNTER — Ambulatory Visit (HOSPITAL_COMMUNITY): Admission: RE | Admit: 2024-04-03 | Source: Home / Self Care | Admitting: Obstetrics and Gynecology

## 2024-04-03 ENCOUNTER — Ambulatory Visit (HOSPITAL_COMMUNITY): Attending: Obstetrics and Gynecology

## 2024-04-03 ENCOUNTER — Other Ambulatory Visit: Payer: Self-pay | Admitting: Obstetrics and Gynecology

## 2024-04-03 MED ORDER — MISOPROSTOL 200 MCG PO TABS: ORAL_TABLET | ORAL | 1 refills | Status: AC

## 2024-04-03 NOTE — Progress Notes (Signed)
 Patient opted to cancel scheduled D&C for missed abortion and proceed with medical management. Patient was contacted by phone and informed that prescription for cytotec  was sent to her pharmacy. Administration instructions reviewed with the patient. She was advised to take ibuprofen  600 mg every 6 hours as needed and to alternate with tylenol . Patient informed to expect vaginal bleeding for the next 7-10 days with the first 2 days being the heaviest. Patient advised to present to MAU or contact the office if she is saturating 1 pad per hour for more than 4 hours. Patient verbalized understanding and all questions were answered

## 2024-04-07 ENCOUNTER — Ambulatory Visit: Payer: Self-pay | Admitting: Obstetrics

## 2024-04-27 ENCOUNTER — Ambulatory Visit: Admitting: Obstetrics and Gynecology

## 2024-04-27 ENCOUNTER — Other Ambulatory Visit (HOSPITAL_COMMUNITY)
Admission: RE | Admit: 2024-04-27 | Discharge: 2024-04-27 | Disposition: A | Discharge status: Home / Self Care | Source: Ambulatory Visit | Attending: Obstetrics and Gynecology | Admitting: Obstetrics and Gynecology

## 2024-04-27 ENCOUNTER — Encounter: Payer: Self-pay | Admitting: Obstetrics and Gynecology

## 2024-04-27 VITALS — BP 106/72 | HR 66 | Ht 62.0 in | Wt 153.0 lb

## 2024-04-27 DIAGNOSIS — O039 Complete or unspecified spontaneous abortion without complication: Secondary | ICD-10-CM | POA: Diagnosis not present

## 2024-04-27 DIAGNOSIS — N898 Other specified noninflammatory disorders of vagina: Secondary | ICD-10-CM | POA: Insufficient documentation

## 2024-04-27 DIAGNOSIS — Z3009 Encounter for other general counseling and advice on contraception: Secondary | ICD-10-CM

## 2024-04-27 DIAGNOSIS — Z3202 Encounter for pregnancy test, result negative: Secondary | ICD-10-CM

## 2024-04-27 LAB — POCT URINE PREGNANCY: Preg Test, Ur: NEGATIVE

## 2024-04-27 MED ORDER — NORETHIN ACE-ETH ESTRAD-FE 1-20 MG-MCG(24) PO TABS: 1.0000 | ORAL_TABLET | Freq: Every day | ORAL | 3 refills | Status: AC

## 2024-04-27 NOTE — Progress Notes (Signed)
 24 y.o. GYN presents for SAB Follow up,  Pt is sexually active and Not using any BC, last unprotected sex was 5 days ago.  Pt wants to discuss BC.  Pt wants Rx for BV.

## 2024-04-27 NOTE — Progress Notes (Signed)
" °  CC: SAB follow up Subjective:    Patient ID: Bianca Webb, female    DOB: 2000/07/06, 24 y.o.   MRN: [id number]  HPI 24 yo [id number] seen for miscarriage follow up.  Pt was originally scheduled for D and E, but decided on medical management.  Pt notes having bleeding after treatment.  UPT was negative today.  Pt desires OCP for birth control going forward.  She is considered about BV or vaginal discharge.   Review of Systems     Objective:   Physical Exam Vitals:   04/27/24 1052  BP: 106/72  Pulse: 66         Assessment & Plan:   1. SAB (spontaneous abortion) (Primary) Miscarriage considered complete as  UPT today is negative - POCT urine pregnancy  2. Vaginal discharge Self swab taken, pt desires oral flagyl  if positive BV confirmed - Cervicovaginal ancillary only  3. Counseling for birth control, oral contraceptives Pt currently having vaginal bleeding with negative UPT, instructions given for Sunday start  - Norethindrone Acetate-Ethinyl Estrad-FE (LOESTRIN 24 FE) 1-20 MG-MCG(24) tablet; Take 1 tablet by mouth daily.  Dispense: 84 tablet; Refill: 3    Jerilynn DELENA Buddle, MD Faculty Attending, [facility] [employer]  "

## 2024-04-29 LAB — CERVICOVAGINAL ANCILLARY ONLY
Bacterial Vaginitis (gardnerella): POSITIVE — AB
Candida Glabrata: NEGATIVE
Candida Vaginitis: POSITIVE — AB
Comment: NEGATIVE
Comment: NEGATIVE
Comment: NEGATIVE
Comment: NEGATIVE
Trichomonas: NEGATIVE

## 2024-05-05 ENCOUNTER — Ambulatory Visit: Payer: Self-pay | Admitting: Obstetrics and Gynecology

## 2024-05-05 DIAGNOSIS — B3731 Acute candidiasis of vulva and vagina: Secondary | ICD-10-CM

## 2024-05-05 DIAGNOSIS — B9689 Other specified bacterial agents as the cause of diseases classified elsewhere: Secondary | ICD-10-CM

## 2024-05-05 MED ORDER — METRONIDAZOLE 500 MG PO TABS: 500.0000 mg | ORAL_TABLET | Freq: Two times a day (BID) | ORAL | 0 refills | Status: AC

## 2024-05-05 MED ORDER — TERCONAZOLE 0.4 % VA CREA: 1.0000 | TOPICAL_CREAM | Freq: Every day | VAGINAL | 0 refills | Status: AC
